# Patient Record
Sex: Female | Born: 1975 | Race: White | Hispanic: No | Marital: Single | Smoking: Former smoker
Health system: Southern US, Community
[De-identification: ages and names within clinical notes are randomized; demographics above are authoritative.]

## PROBLEM LIST (undated history)

## (undated) DIAGNOSIS — A549 Gonococcal infection, unspecified: Secondary | ICD-10-CM

## (undated) DIAGNOSIS — F419 Anxiety disorder, unspecified: Secondary | ICD-10-CM

## (undated) DIAGNOSIS — N809 Endometriosis, unspecified: Secondary | ICD-10-CM

## (undated) HISTORY — PX: TUBAL LIGATION: SHX77

## (undated) HISTORY — DX: Anxiety disorder, unspecified: F41.9

## (undated) HISTORY — PX: OTHER SURGICAL HISTORY: SHX169

---

## 2006-12-12 ENCOUNTER — Emergency Department (HOSPITAL_COMMUNITY): Admission: EM | Admit: 2006-12-12 | Discharge: 2006-12-12 | Payer: Self-pay | Admitting: Emergency Medicine

## 2007-09-08 ENCOUNTER — Emergency Department (HOSPITAL_COMMUNITY): Admission: EM | Admit: 2007-09-08 | Discharge: 2007-09-09 | Payer: Self-pay | Admitting: Emergency Medicine

## 2010-10-15 LAB — WET PREP, GENITAL: Clue Cells Wet Prep HPF POC: NONE SEEN

## 2010-10-15 LAB — HERPES SIMPLEX VIRUS CULTURE

## 2010-10-22 ENCOUNTER — Emergency Department (HOSPITAL_COMMUNITY)
Admission: EM | Admit: 2010-10-22 | Discharge: 2010-10-22 | Disposition: A | Discharge status: Other Acute Inpatient Hospital | Payer: Self-pay | Attending: Emergency Medicine | Admitting: Emergency Medicine

## 2010-10-22 DIAGNOSIS — Z79899 Other long term (current) drug therapy: Secondary | ICD-10-CM | POA: Insufficient documentation

## 2010-10-22 DIAGNOSIS — F1110 Opioid abuse, uncomplicated: Secondary | ICD-10-CM | POA: Insufficient documentation

## 2010-10-22 LAB — URINALYSIS, ROUTINE W REFLEX MICROSCOPIC
Bilirubin Urine: NEGATIVE
Ketones, ur: NEGATIVE mg/dL
Nitrite: POSITIVE — AB
Urobilinogen, UA: 0.2 mg/dL (ref 0.0–1.0)
pH: 5.5 (ref 5.0–8.0)

## 2010-10-22 LAB — BASIC METABOLIC PANEL
BUN: 11 mg/dL (ref 6–23)
Creatinine, Ser: 0.58 mg/dL (ref 0.50–1.10)
GFR calc Af Amer: >90 mL/min (ref >90)
GFR calc non Af Amer: >90 mL/min (ref >90)

## 2010-10-22 LAB — CBC
MCH: 31 pg (ref 26.0–34.0)
MCHC: 34.8 g/dL (ref 30.0–36.0)
MCV: 89 fL (ref 78.0–100.0)
Platelets: 206 10*3/uL (ref 150–400)
RDW: 13.7 % (ref 11.5–15.5)

## 2010-10-22 LAB — DIFFERENTIAL
Eosinophils Absolute: 0 10*3/uL (ref 0.0–0.7)
Eosinophils Relative: 1 % (ref 0–5)
Lymphs Abs: 2.4 10*3/uL (ref 0.7–4.0)
Monocytes Relative: 7 % (ref 3–12)

## 2010-10-22 LAB — URINE MICROSCOPIC-ADD ON

## 2010-10-22 LAB — RAPID URINE DRUG SCREEN, HOSP PERFORMED
Barbiturates: NOT DETECTED
Benzodiazepines: NOT DETECTED
Cocaine: NOT DETECTED

## 2011-08-23 ENCOUNTER — Encounter (HOSPITAL_COMMUNITY): Payer: Self-pay | Admitting: *Deleted

## 2011-08-23 ENCOUNTER — Emergency Department (INDEPENDENT_AMBULATORY_CARE_PROVIDER_SITE_OTHER)
Admission: EM | Admit: 2011-08-23 | Discharge: 2011-08-23 | Disposition: A | Discharge status: Home / Self Care | Payer: Self-pay | Source: Home / Self Care

## 2011-08-23 DIAGNOSIS — R071 Chest pain on breathing: Secondary | ICD-10-CM

## 2011-08-23 DIAGNOSIS — R0789 Other chest pain: Secondary | ICD-10-CM

## 2011-08-23 DIAGNOSIS — T148XXA Other injury of unspecified body region, initial encounter: Secondary | ICD-10-CM

## 2011-08-23 MED ORDER — NAPROXEN 500 MG PO TABS: 500.0000 mg | ORAL_TABLET | Freq: Two times a day (BID) | ORAL | Status: DC

## 2011-08-23 NOTE — ED Notes (Signed)
Breast  Pain    r  Side         Worse  On  Movement  Pain  r  Side  Of  Breast  -  Tender  To  Touch  denys  Any  Injury        Symptoms  X  3  Days  Pt reports  Nipple  Is   Tender    -    Pt  Also  Reports  Sinus  Congestion      And  Pain

## 2011-08-23 NOTE — ED Provider Notes (Signed)
History     CSN: [id number]  Arrival date & time 08/23/11  1147   None     Chief Complaint  Patient presents with  . Breast Pain    (Consider location/radiation/quality/duration/timing/severity/associated sxs/prior treatment) Patient is a 36 y.o. female presenting with chest pain. The history is provided by the patient.  Chest Pain The chest pain began 5 - 7 days ago. Chest pain occurs intermittently. The chest pain is unchanged. The severity of the pain is mild. The quality of the pain is described as burning and sharp. The pain radiates to the left shoulder. Chest pain is worsened by certain positions. She tried nothing for the symptoms. Risk factors include no known risk factors.     History reviewed. No pertinent past medical history.  Past Surgical History  Procedure Date  . Btl     History reviewed. No pertinent family history.  History  Substance Use Topics  . Smoking status: Current Everyday Smoker  . Smokeless tobacco: Not on file  . Alcohol Use: Yes    OB History    Grav Para Term Preterm Abortions TAB SAB Ect Mult Living                  Review of Systems  Constitutional: Negative.   HENT: Negative.   Respiratory: Negative.   Cardiovascular: Positive for chest pain.  Musculoskeletal: Positive for arthralgias. Negative for back pain, joint swelling and gait problem.  Skin: Negative.     Allergies  Review of patient's allergies indicates not on file.  Home Medications   Current Outpatient Rx  Name Route Sig Dispense Refill  . MOTRIN PO Oral Take by mouth.    Marland Kitchen NAPROXEN 500 MG PO TABS Oral Take 1 tablet (500 mg total) by mouth 2 (two) times daily. 30 tablet 0    BP 137/92  Pulse 93  Temp 98.6 F (37 C) (Oral)  Resp 16  SpO2 99%  LMP 08/09/2011  Physical Exam  Nursing note and vitals reviewed. Constitutional: She is oriented to person, place, and time. Vital signs are normal. She appears well-developed and well-nourished. She is active  and cooperative.  HENT:  Head: Normocephalic.  Eyes: Conjunctivae are normal. Pupils are equal, round, and reactive to light. No scleral icterus.  Neck: Trachea normal and normal range of motion. Neck supple.  Cardiovascular: Normal rate and regular rhythm.   Pulmonary/Chest: Effort normal and breath sounds normal. No respiratory distress.  Musculoskeletal: She exhibits tenderness.       Right shoulder: Normal.       Cervical back: Normal.       Thoracic back: Normal.       Right upper arm: Normal.       Arms:      Right chest, midaxillary line  Lymphadenopathy:    She has no cervical adenopathy.  Neurological: She is alert and oriented to person, place, and time. No cranial nerve deficit or sensory deficit.  Skin: Skin is warm and dry.  Psychiatric: She has a normal mood and affect. Her speech is normal and behavior is normal. Judgment and thought content normal. Cognition and memory are normal.    ED Course  Procedures (including critical care time)  Labs Reviewed - No data to display No results found.   1. Muscle strain   2. Right-sided chest wall pain       MDM  Rest, intermittent application of cold packs (later, may switch to heat, but do not sleep on heating  pad), analgesics as recommended. Proper lifting with avoidance of heavy lifting discussed. Call or return to clinic prn if these symptoms worsen or fail to improve as anticipated. Imaging not indicated at this time.         Johnsie Kindred, NP 08/23/11 1245

## 2011-08-24 NOTE — ED Provider Notes (Signed)
Medical screening examination/treatment/procedure(s) were performed by resident physician or non-physician practitioner and as supervising physician I was immediately available for consultation/collaboration.   Barkley Bruns MD.    Linna Hoff, MD 08/24/11 [phone]

## 2011-09-10 ENCOUNTER — Encounter (HOSPITAL_COMMUNITY): Payer: Self-pay | Admitting: Emergency Medicine

## 2011-09-10 DIAGNOSIS — F17200 Nicotine dependence, unspecified, uncomplicated: Secondary | ICD-10-CM | POA: Insufficient documentation

## 2011-09-10 DIAGNOSIS — A64 Unspecified sexually transmitted disease: Secondary | ICD-10-CM | POA: Insufficient documentation

## 2011-09-10 DIAGNOSIS — R935 Abnormal findings on diagnostic imaging of other abdominal regions, including retroperitoneum: Secondary | ICD-10-CM | POA: Insufficient documentation

## 2011-09-10 DIAGNOSIS — R1031 Right lower quadrant pain: Secondary | ICD-10-CM | POA: Insufficient documentation

## 2011-09-10 DIAGNOSIS — Z8742 Personal history of other diseases of the female genital tract: Secondary | ICD-10-CM | POA: Insufficient documentation

## 2011-09-10 NOTE — ED Notes (Signed)
PT. REPORTS RLQ PAIN RADIATING TO RIGHT LOWER BACK AND RIGHT LEG ONSET THIS MORNING WITH SLIGHT NAUSEA. DENEIS VOMITTING OR DIARRHEA , NO FEVER OR VAGINAL DISCHARGE.

## 2011-09-11 ENCOUNTER — Emergency Department (HOSPITAL_COMMUNITY): Payer: Self-pay

## 2011-09-11 ENCOUNTER — Emergency Department (HOSPITAL_COMMUNITY)
Admission: EM | Admit: 2011-09-11 | Discharge: 2011-09-11 | Disposition: A | Discharge status: Home / Self Care | Payer: Self-pay | Attending: Emergency Medicine | Admitting: Emergency Medicine

## 2011-09-11 DIAGNOSIS — A64 Unspecified sexually transmitted disease: Secondary | ICD-10-CM

## 2011-09-11 HISTORY — DX: Endometriosis, unspecified: N80.9

## 2011-09-11 LAB — CBC WITH DIFFERENTIAL/PLATELET
Basophils Absolute: 0.1 10*3/uL (ref 0.0–0.1)
Basophils Relative: 0 % (ref 0–1)
Eosinophils Absolute: 0.1 10*3/uL (ref 0.0–0.7)
Eosinophils Relative: 1 % (ref 0–5)
HCT: 36.4 % (ref 36.0–46.0)
MCH: 30.3 pg (ref 26.0–34.0)
MCHC: 34.1 g/dL (ref 30.0–36.0)
Monocytes Absolute: 0.9 10*3/uL (ref 0.1–1.0)
Neutro Abs: 7.3 10*3/uL (ref 1.7–7.7)
RDW: 13.4 % (ref 11.5–15.5)

## 2011-09-11 LAB — URINALYSIS, ROUTINE W REFLEX MICROSCOPIC
Bilirubin Urine: NEGATIVE
Ketones, ur: 15 mg/dL — AB
Nitrite: NEGATIVE
Protein, ur: NEGATIVE mg/dL

## 2011-09-11 LAB — BASIC METABOLIC PANEL
Calcium: 9.2 mg/dL (ref 8.4–10.5)
Creatinine, Ser: 0.57 mg/dL (ref 0.50–1.10)
GFR calc Af Amer: >90 mL/min (ref >90)
GFR calc non Af Amer: >90 mL/min (ref >90)

## 2011-09-11 LAB — WET PREP, GENITAL: Yeast Wet Prep HPF POC: NONE SEEN

## 2011-09-11 MED ORDER — OXYCODONE-ACETAMINOPHEN 5-325 MG PO TABS: 1.0000 | ORAL_TABLET | Freq: Four times a day (QID) | ORAL | Status: AC | PRN

## 2011-09-11 MED ORDER — LIDOCAINE HCL (PF) 1 % IJ SOLN
INTRAMUSCULAR | Status: AC
  Filled 2011-09-11: qty 5

## 2011-09-11 MED ORDER — CEFTRIAXONE SODIUM 250 MG IJ SOLR
250.0000 mg | Freq: Once | INTRAMUSCULAR | Status: AC
  Administered 2011-09-11: 250 mg via INTRAMUSCULAR
  Filled 2011-09-11: qty 250

## 2011-09-11 MED ORDER — METRONIDAZOLE 500 MG PO TABS
2000.0000 mg | ORAL_TABLET | Freq: Once | ORAL | Status: AC
  Administered 2011-09-11: 2000 mg via ORAL
  Filled 2011-09-11: qty 4

## 2011-09-11 MED ORDER — OXYCODONE-ACETAMINOPHEN 5-325 MG PO TABS
1.0000 | ORAL_TABLET | Freq: Once | ORAL | Status: AC
  Administered 2011-09-11: 1 via ORAL
  Filled 2011-09-11: qty 1

## 2011-09-11 MED ORDER — AZITHROMYCIN 250 MG PO TABS
1000.0000 mg | ORAL_TABLET | Freq: Once | ORAL | Status: AC
  Administered 2011-09-11: 1000 mg via ORAL
  Filled 2011-09-11: qty 4

## 2011-09-11 NOTE — ED Provider Notes (Signed)
History     CSN: [id number]  Arrival date & time 09/10/11  2348   First MD Initiated Contact with Patient 09/11/11 [phone]      Chief Complaint  Patient presents with  . Abdominal Pain    (Consider location/radiation/quality/duration/timing/severity/associated sxs/prior treatment) HPI Comments: Patient is a 36 year old female with a history of endometriosis that presents emergency department with chief complaint of right lower quadrant abdominal pain.  Onset of symptoms began earlier this morning with abdominal cramping that gradually localized to the right lower quadrant. Pain is currently 8/10 in severity and described as sharp.  Pain is worsened with movement and palpation.  Patient has not tried any medications to alleviate pain.  Patient denies any abnormal discharge, nausea, vomiting, diarrhea, constipation, fever, night sweats, or chills.  Last menstrual period ended yesterday.  Last normal bowel movement yesterday as well.  Patient did not have a history of abdominal surgery.  Patient is a 36 y.o. female presenting with abdominal pain. The history is provided by the patient.  Abdominal Pain The primary symptoms of the illness include abdominal pain. The primary symptoms of the illness do not include fever, shortness of breath, vomiting, diarrhea or dysuria.  Symptoms associated with the illness do not include chills, urgency or frequency.    Past Medical History  Diagnosis Date  . Endometriosis     Past Surgical History  Procedure Date  . Btl   . Tubal ligation     No family history on file.  History  Substance Use Topics  . Smoking status: Current Everyday Smoker  . Smokeless tobacco: Not on file  . Alcohol Use: Yes    OB History    Grav Para Term Preterm Abortions TAB SAB Ect Mult Living                  Review of Systems  Constitutional: Negative for fever, chills and appetite change.  HENT: Negative for congestion.   Eyes: Negative for visual disturbance.    Respiratory: Negative for shortness of breath.   Cardiovascular: Negative for chest pain and leg swelling.  Gastrointestinal: Positive for abdominal pain. Negative for vomiting, diarrhea and abdominal distention.  Genitourinary: Negative for dysuria, urgency and frequency.  Neurological: Negative for dizziness, syncope, weakness, light-headedness, numbness and headaches.  Psychiatric/Behavioral: Negative for confusion.  All other systems reviewed and are negative.    Allergies  Review of patient's allergies indicates no known allergies.  Home Medications   Current Outpatient Rx  Name Route Sig Dispense Refill  . IBUPROFEN 200 MG PO TABS Oral Take 200 mg by mouth every 6 (six) hours as needed. For pain    . NAPROXEN SODIUM 220 MG PO TABS Oral Take 220 mg by mouth daily as needed. For pain      BP 135/91  Pulse 94  Temp 98.5 F (36.9 C) (Oral)  Resp 18  SpO2 100%  LMP 08/05/2011  Physical Exam  Nursing note and vitals reviewed. Constitutional: Vital signs are normal. She appears well-developed and well-nourished. No distress.  HENT:  Head: Normocephalic and atraumatic.  Mouth/Throat: Uvula is midline, oropharynx is clear and moist and mucous membranes are normal.  Eyes: Conjunctivae and EOM are normal. Pupils are equal, round, and reactive to light.  Neck: Normal range of motion and full passive range of motion without pain. Neck supple. No spinous process tenderness and no muscular tenderness present. No rigidity. No Brudzinski's sign noted.  Cardiovascular: Normal rate and regular rhythm.   Pulmonary/Chest: Effort  normal and breath sounds normal. No accessory muscle usage. Not tachypneic. No respiratory distress.  Abdominal: Soft. Normal appearance. She exhibits no distension, no ascites, no pulsatile midline mass and no mass. There is tenderness. There is no CVA tenderness. No hernia.         Bowel sounds present, soft abdomen with RLQ ttp, no peritoneal signs.    Genitourinary:       Exam chaperoned.  External genitalia normal without lesions. Cervix normal without purulent discharge or motion tenderness.  Tenderness to palpation of right adnexa but no palpable masses.  No tenderness to left adnexa.  GC and Chlamydia cultures pending  Lymphadenopathy:    She has no cervical adenopathy.  Neurological: She is alert.  Skin: Skin is warm and dry. No rash noted. She is not diaphoretic.  Psychiatric: She has a normal mood and affect. Her speech is normal and behavior is normal.    ED Course  Procedures (including critical care time)  Labs Reviewed  CBC WITH DIFFERENTIAL - Abnormal; Notable for the following:    WBC 11.7 (*)     All other components within normal limits  URINALYSIS, ROUTINE W REFLEX MICROSCOPIC - Abnormal; Notable for the following:    APPearance CLOUDY (*)     Hgb urine dipstick MODERATE (*)     Ketones, ur 15 (*)     Leukocytes, UA TRACE (*)     All other components within normal limits  URINE MICROSCOPIC-ADD ON - Abnormal; Notable for the following:    Bacteria, UA FEW (*)     All other components within normal limits  WET PREP, GENITAL - Abnormal; Notable for the following:    Trich, Wet Prep MODERATE (*)     Clue Cells Wet Prep HPF POC MANY (*)     WBC, Wet Prep HPF POC TOO NUMEROUS TO COUNT (*)     All other components within normal limits  BASIC METABOLIC PANEL  POCT PREGNANCY, URINE  GC/CHLAMYDIA PROBE AMP, GENITAL   US Transvaginal Non-ob  09/11/2011  *RADIOLOGY REPORT*  Clinical Data:  Right lower quadrant pain question ovarian torsion  TRANSABDOMINAL AND TRANSVAGINAL ULTRASOUND OF PELVIS DOPPLER ULTRASOUND OF OVARIES  Technique:  Both transabdominal and transvaginal ultrasound examinations of the pelvis were performed. Transabdominal technique was performed for global imaging of the pelvis including uterus, ovaries, adnexal regions, and pelvic cul-de-sac.  It was necessary to proceed with endovaginal exam following the  transabdominal exam to visualize the endometrium and ovaries.  Color and duplex Doppler ultrasound was utilized to evaluate blood flow to the ovaries.  Comparison:  None  Findings:  Uterus:  9.2 cm length by 3.9 cm AP by 5.8 cm transverse.  Normal morphology without mass.  Endometrium:  4 mm thick, normal.  No endometrial fluid.  Right ovary: 3.0 x 1.5 x 1.9 cm.  Normal morphology without mass. Blood flow present within right ovary on color Doppler imaging.  Left ovary: 2.4 x 2.0 x 2.0 cm.  Normal morphology without mass. Internal blood flow present within the left ovary on color Doppler imaging.  Pulsed Doppler evaluation demonstrates normal low-resistance arterial and venous waveforms in both ovaries.  In the right adnexa, adjacent to and superior to the right ovary, a tubular hypoechoic/fluid structure is identified containing a few scattered low level internal echoes and question slight wall thickening, question hydrosalpinx versus pyosalpinx.  Tubo-ovarian abscess not excluded.  IMPRESSION: Unremarkable uterus and ovaries. Hypoechoic tubular structure adjacent to the right ovary, question dilated fallopian  tube, question hydrosalpinx versus pyosalpinx or tubo-ovarian abscess.  No sonographic evidence for ovarian torsion.   Original Report Authenticated By: Lollie Marrow, M.D.    US Pelvis Complete  09/11/2011  *RADIOLOGY REPORT*  Clinical Data:  Right lower quadrant pain question ovarian torsion  TRANSABDOMINAL AND TRANSVAGINAL ULTRASOUND OF PELVIS DOPPLER ULTRASOUND OF OVARIES  Technique:  Both transabdominal and transvaginal ultrasound examinations of the pelvis were performed. Transabdominal technique was performed for global imaging of the pelvis including uterus, ovaries, adnexal regions, and pelvic cul-de-sac.  It was necessary to proceed with endovaginal exam following the transabdominal exam to visualize the endometrium and ovaries.  Color and duplex Doppler ultrasound was utilized to evaluate blood  flow to the ovaries.  Comparison:  None  Findings:  Uterus:  9.2 cm length by 3.9 cm AP by 5.8 cm transverse.  Normal morphology without mass.  Endometrium:  4 mm thick, normal.  No endometrial fluid.  Right ovary: 3.0 x 1.5 x 1.9 cm.  Normal morphology without mass. Blood flow present within right ovary on color Doppler imaging.  Left ovary: 2.4 x 2.0 x 2.0 cm.  Normal morphology without mass. Internal blood flow present within the left ovary on color Doppler imaging.  Pulsed Doppler evaluation demonstrates normal low-resistance arterial and venous waveforms in both ovaries.  In the right adnexa, adjacent to and superior to the right ovary, a tubular hypoechoic/fluid structure is identified containing a few scattered low level internal echoes and question slight wall thickening, question hydrosalpinx versus pyosalpinx.  Tubo-ovarian abscess not excluded.  IMPRESSION: Unremarkable uterus and ovaries. Hypoechoic tubular structure adjacent to the right ovary, question dilated fallopian tube, question hydrosalpinx versus pyosalpinx or tubo-ovarian abscess.  No sonographic evidence for ovarian torsion.   Original Report Authenticated By: Lollie Marrow, M.D.    [place] Art/ven Flow Abd Pelv Doppler  09/11/2011  *RADIOLOGY REPORT*  Clinical Data:  Right lower quadrant pain question ovarian torsion  TRANSABDOMINAL AND TRANSVAGINAL ULTRASOUND OF PELVIS DOPPLER ULTRASOUND OF OVARIES  Technique:  Both transabdominal and transvaginal ultrasound examinations of the pelvis were performed. Transabdominal technique was performed for global imaging of the pelvis including uterus, ovaries, adnexal regions, and pelvic cul-de-sac.  It was necessary to proceed with endovaginal exam following the transabdominal exam to visualize the endometrium and ovaries.  Color and duplex Doppler ultrasound was utilized to evaluate blood flow to the ovaries.  Comparison:  None  Findings:  Uterus:  9.2 cm length by 3.9 cm AP by 5.8 cm transverse.  Normal  morphology without mass.  Endometrium:  4 mm thick, normal.  No endometrial fluid.  Right ovary: 3.0 x 1.5 x 1.9 cm.  Normal morphology without mass. Blood flow present within right ovary on color Doppler imaging.  Left ovary: 2.4 x 2.0 x 2.0 cm.  Normal morphology without mass. Internal blood flow present within the left ovary on color Doppler imaging.  Pulsed Doppler evaluation demonstrates normal low-resistance arterial and venous waveforms in both ovaries.  In the right adnexa, adjacent to and superior to the right ovary, a tubular hypoechoic/fluid structure is identified containing a few scattered low level internal echoes and question slight wall thickening, question hydrosalpinx versus pyosalpinx.  Tubo-ovarian abscess not excluded.  IMPRESSION: Unremarkable uterus and ovaries. Hypoechoic tubular structure adjacent to the right ovary, question dilated fallopian tube, question hydrosalpinx versus pyosalpinx or tubo-ovarian abscess.  No sonographic evidence for ovarian torsion.   Original Report Authenticated By: Lollie Marrow, M.D.      No  diagnosis found.    MDM  STD  Discussed importance of using protection when sexually active. Pt understands that they have GC/Chlamydia cultures pending and that they will need to inform all sexual partners if results return positive. Pt has been treated prophylacticly with azithromycin and rocephin due to pts history, pelvic exam, and wet prep with increased WBCs. Pt not concerning for PID because hemodynamically stable and no cervical motion tenderness on pelvic exam. Pt has also been treated with flagyl for Bacterial Vaginosis. Pt has been advised to not drink alcohol while on this medication. [place] with hypoechoic tubular structure next to Right ovary discussed with GYN. D/t normal vitals and no peritoneal signs on exam pt will be dc with close follow up and strict return precautions.   Consult: GYN, Dr. Henderson Cloud- Pt is to f-u as an OP in his office  thursaday.         Jaci Carrel, [place] 09/11/11 [phone]

## 2011-09-11 NOTE — ED Notes (Signed)
Pt. Verbalized understanding of follow up and rx x1.

## 2011-09-11 NOTE — ED Provider Notes (Signed)
Medical screening examination/treatment/procedure(s) were performed by non-physician practitioner and as supervising physician I was immediately available for consultation/collaboration.  Cheri Guppy, MD 09/11/11 [phone]

## 2011-09-14 NOTE — ED Notes (Signed)
Attempted to contact patient. Wrong phone number.

## 2011-09-14 NOTE — ED Notes (Signed)
+  Gonorrhea. Patient treated with Rocephin and Zithromax. Per protocol MD. [facility] faxed. °

## 2012-06-01 ENCOUNTER — Emergency Department (HOSPITAL_COMMUNITY)
Admission: EM | Admit: 2012-06-01 | Discharge: 2012-06-01 | Disposition: A | Discharge status: Home / Self Care | Payer: Self-pay | Attending: Emergency Medicine | Admitting: Emergency Medicine

## 2012-06-01 ENCOUNTER — Encounter (HOSPITAL_COMMUNITY): Payer: Self-pay | Admitting: Adult Health

## 2012-06-01 DIAGNOSIS — Z8742 Personal history of other diseases of the female genital tract: Secondary | ICD-10-CM | POA: Insufficient documentation

## 2012-06-01 DIAGNOSIS — F17200 Nicotine dependence, unspecified, uncomplicated: Secondary | ICD-10-CM | POA: Insufficient documentation

## 2012-06-01 DIAGNOSIS — N898 Other specified noninflammatory disorders of vagina: Secondary | ICD-10-CM | POA: Insufficient documentation

## 2012-06-01 DIAGNOSIS — Z3202 Encounter for pregnancy test, result negative: Secondary | ICD-10-CM | POA: Insufficient documentation

## 2012-06-01 DIAGNOSIS — A64 Unspecified sexually transmitted disease: Secondary | ICD-10-CM

## 2012-06-01 DIAGNOSIS — R300 Dysuria: Secondary | ICD-10-CM | POA: Insufficient documentation

## 2012-06-01 DIAGNOSIS — Z118 Encounter for screening for other infectious and parasitic diseases: Secondary | ICD-10-CM | POA: Insufficient documentation

## 2012-06-01 HISTORY — DX: Gonococcal infection, unspecified: A54.9

## 2012-06-01 LAB — URINALYSIS, ROUTINE W REFLEX MICROSCOPIC
Ketones, ur: 40 mg/dL — AB
Leukocytes, UA: NEGATIVE
Nitrite: NEGATIVE
Protein, ur: NEGATIVE mg/dL
Urobilinogen, UA: 1 mg/dL (ref 0.0–1.0)
pH: 7 (ref 5.0–8.0)

## 2012-06-01 MED ORDER — LIDOCAINE HCL (PF) 1 % IJ SOLN
1.0000 mL | Freq: Once | INTRAMUSCULAR | Status: AC
  Administered 2012-06-01: 1 mL
  Filled 2012-06-01: qty 5

## 2012-06-01 MED ORDER — CEFTRIAXONE SODIUM 250 MG IJ SOLR
250.0000 mg | Freq: Once | INTRAMUSCULAR | Status: AC
  Administered 2012-06-01: 250 mg via INTRAMUSCULAR
  Filled 2012-06-01: qty 250

## 2012-06-01 MED ORDER — AZITHROMYCIN 250 MG PO TABS
1000.0000 mg | ORAL_TABLET | Freq: Once | ORAL | Status: AC
  Administered 2012-06-01: 1000 mg via ORAL
  Filled 2012-06-01: qty 4

## 2012-06-01 NOTE — ED Provider Notes (Signed)
History  This chart was scribed for non-physician practitioner working with Gwyneth Sprout, MD by Greggory Stallion, ED scribe. This patient was seen in room TR09C/TR09C and the patient's care was started at 6:00 PM.  CSN: [id number]  Arrival date & time 06/01/12  1510   Chief Complaint  Patient presents with  . SEXUALLY TRANSMITTED DISEASE    The history is provided by the patient. No language interpreter was used.    HPI Comments: Thressa Shiffer is a 37 y.o. female who presents to the Emergency Department complaining of a sexually transmitted disease. Pt states she has had thick, whitish/yellowish vaginal discharge that began in February with associated dysuria and vaginal itching. She states, "I am 100% sure that I have gonorrhea. I have had it before and this feels the same." She states she was last sexually active with this person one month ago. Pt denies vaginal pain, fever, neck pain, sore throat, visual disturbance, CP, cough, SOB, abdominal pain, nausea, emesis, diarrhea, back pain, HA, weakness, numbness and rash as associated symptoms. She states she still has normal menses.    Past Medical History  Diagnosis Date  . Endometriosis   . Gonorrhea     Past Surgical History  Procedure Laterality Date  . Btl    . Tubal ligation      History reviewed. No pertinent family history.  History  Substance Use Topics  . Smoking status: Current Every Day Smoker  . Smokeless tobacco: Not on file  . Alcohol Use: Yes    OB History   Grav Para Term Preterm Abortions TAB SAB Ect Mult Living                  Review of Systems  A complete 10 system review of systems was obtained and all systems are negative except as noted in the HPI and PMH.   Allergies  Review of patient's allergies indicates no known allergies.  Home Medications   Current Outpatient Rx  Name  Route  Sig  Dispense  Refill  . ibuprofen (ADVIL,MOTRIN) 200 MG tablet   Oral   Take 200 mg by mouth every 6  (six) hours as needed. For pain         . naproxen sodium (ANAPROX) 220 MG tablet   Oral   Take 220 mg by mouth daily as needed. For pain           BP 134/86  Pulse 65  Temp(Src) 98.5 F (36.9 C) (Oral)  Resp 18  SpO2 98%  Physical Exam  Nursing note and vitals reviewed. Constitutional: She is oriented to person, place, and time. She appears well-developed and well-nourished.  HENT:  Head: Normocephalic and atraumatic.  Neck: Normal range of motion.  Cardiovascular: Normal rate and regular rhythm.   Pulmonary/Chest: Effort normal and breath sounds normal.  Abdominal: Soft. There is no tenderness.  Genitourinary:  Vaginal discharge. No cervical motion tenderness. No adnexal tenderness.   Musculoskeletal: Normal range of motion.  Neurological: She is alert and oriented to person, place, and time.  Skin: Skin is warm and dry.    ED Course  Procedures (including critical care time)  DIAGNOSTIC STUDIES: Oxygen Saturation is 98% on RA, normal by my interpretation.    COORDINATION OF CARE: 6:10 PM-Discussed treatment plan with pt at bedside and pt agreed to plan.   Labs Reviewed  URINALYSIS, ROUTINE W REFLEX MICROSCOPIC - Abnormal; Notable for the following:    Bilirubin Urine SMALL (*)    Ketones,  ur 40 (*)    All other components within normal limits  POCT PREGNANCY, URINE   No results found.   1. Vaginal discharge   2. STD (female)       MDM   Patient with worsening vaginal discharge over last few months. She denies any fever or systemic symptoms. No abdominal or back pain. Mild dysuria over the last one to 2 weeks and normal menses. Patient states she's had gonorrhea in the past and was treated last year but then patient developed symptoms again in January or February he was having unprotected sex with the same partner think she had a second impaction. On exam she has mild discharge but no other exam findings. Few white blood cells on wet prep and a negative  urine pregnancy and urine tests.  Patient treated with Rocephin and azithromycin and discharged home.      I personally performed the services described in this documentation, which was scribed in my presence.  The recorded information has been reviewed and considered.   Gwyneth Sprout, MD 06/01/12 [phone]

## 2012-06-01 NOTE — ED Notes (Signed)
Presents with vaginal discharge that began in feb. She states, "I am 100% that I have gonorrhea.. I have had it before and this feels the same"  Denies pain. Reports thick vaginal discharge and burning with urination.

## 2012-06-02 LAB — GC/CHLAMYDIA PROBE AMP
CT Probe RNA: NEGATIVE
GC Probe RNA: NEGATIVE

## 2012-07-03 ENCOUNTER — Emergency Department (HOSPITAL_COMMUNITY)
Admission: EM | Admit: 2012-07-03 | Discharge: 2012-07-04 | Disposition: A | Discharge status: Other Acute Inpatient Hospital | Payer: Self-pay | Attending: Emergency Medicine | Admitting: Emergency Medicine

## 2012-07-03 ENCOUNTER — Encounter (HOSPITAL_COMMUNITY): Payer: Self-pay | Admitting: Family Medicine

## 2012-07-03 DIAGNOSIS — F329 Major depressive disorder, single episode, unspecified: Secondary | ICD-10-CM | POA: Insufficient documentation

## 2012-07-03 DIAGNOSIS — R45851 Suicidal ideations: Secondary | ICD-10-CM | POA: Insufficient documentation

## 2012-07-03 DIAGNOSIS — Z3202 Encounter for pregnancy test, result negative: Secondary | ICD-10-CM | POA: Insufficient documentation

## 2012-07-03 DIAGNOSIS — F1110 Opioid abuse, uncomplicated: Secondary | ICD-10-CM

## 2012-07-03 DIAGNOSIS — F1120 Opioid dependence, uncomplicated: Secondary | ICD-10-CM | POA: Insufficient documentation

## 2012-07-03 DIAGNOSIS — F17200 Nicotine dependence, unspecified, uncomplicated: Secondary | ICD-10-CM | POA: Insufficient documentation

## 2012-07-03 DIAGNOSIS — Z8619 Personal history of other infectious and parasitic diseases: Secondary | ICD-10-CM | POA: Insufficient documentation

## 2012-07-03 DIAGNOSIS — F32A Depression, unspecified: Secondary | ICD-10-CM

## 2012-07-03 DIAGNOSIS — F3289 Other specified depressive episodes: Secondary | ICD-10-CM | POA: Insufficient documentation

## 2012-07-03 DIAGNOSIS — Z8742 Personal history of other diseases of the female genital tract: Secondary | ICD-10-CM | POA: Insufficient documentation

## 2012-07-03 LAB — COMPREHENSIVE METABOLIC PANEL
ALT: 11 U/L (ref 0–35)
Alkaline Phosphatase: 48 U/L (ref 39–117)
BUN: 9 mg/dL (ref 6–23)
Chloride: 103 mEq/L (ref 96–112)
GFR calc Af Amer: >90 mL/min (ref >90)
Glucose, Bld: 91 mg/dL (ref 70–99)
Potassium: 3.5 mEq/L (ref 3.5–5.1)
Sodium: 140 mEq/L (ref 135–145)
Total Bilirubin: 0.3 mg/dL (ref 0.3–1.2)

## 2012-07-03 LAB — CBC
HCT: 40.8 % (ref 36.0–46.0)
Hemoglobin: 14.3 g/dL (ref 12.0–15.0)
RBC: 4.77 MIL/uL (ref 3.87–5.11)
WBC: 12.4 10*3/uL — ABNORMAL HIGH (ref 4.0–10.5)

## 2012-07-03 LAB — RAPID URINE DRUG SCREEN, HOSP PERFORMED
Amphetamines: NOT DETECTED
Barbiturates: NOT DETECTED
Cocaine: NOT DETECTED
Opiates: POSITIVE — AB
Tetrahydrocannabinol: POSITIVE — AB

## 2012-07-03 LAB — ETHANOL: Alcohol, Ethyl (B): <11 mg/dL (ref 0–11)

## 2012-07-03 MED ORDER — ONDANSETRON HCL 4 MG PO TABS: 4.0000 mg | ORAL_TABLET | Freq: Three times a day (TID) | ORAL | Status: DC | PRN

## 2012-07-03 MED ORDER — ALUM & MAG HYDROXIDE-SIMETH 200-200-20 MG/5ML PO SUSP: 30.0000 mL | ORAL | Status: DC | PRN

## 2012-07-03 MED ORDER — IBUPROFEN 400 MG PO TABS
600.0000 mg | ORAL_TABLET | Freq: Three times a day (TID) | ORAL | Status: DC | PRN
  Administered 2012-07-03 – 2012-07-04 (×2): 600 mg via ORAL
  Filled 2012-07-03 (×2): qty 1

## 2012-07-03 MED ORDER — ACETAMINOPHEN 325 MG PO TABS: 650.0000 mg | ORAL_TABLET | ORAL | Status: DC | PRN

## 2012-07-03 MED ORDER — ZOLPIDEM TARTRATE 5 MG PO TABS: 5.0000 mg | ORAL_TABLET | Freq: Every evening | ORAL | Status: DC | PRN

## 2012-07-03 MED ORDER — LORAZEPAM 1 MG PO TABS
1.0000 mg | ORAL_TABLET | Freq: Three times a day (TID) | ORAL | Status: DC | PRN
  Administered 2012-07-03: 1 mg via ORAL
  Filled 2012-07-03: qty 1

## 2012-07-03 MED ORDER — NICOTINE 21 MG/24HR TD PT24
21.0000 mg | MEDICATED_PATCH | Freq: Every day | TRANSDERMAL | Status: DC
  Administered 2012-07-03: 21 mg via TRANSDERMAL
  Filled 2012-07-03 (×2): qty 1

## 2012-07-03 NOTE — ED Provider Notes (Signed)
History    This chart was scribed for non-physician practitioner, Fayrene Helper PA-C, working with Gwyneth Sprout, MD by Donne Anon, ED Scribe. This patient was seen in room TR06C/TR06C and the patient's care was started at 1901.  CSN: [id number] Arrival date & time 07/03/12  1719  First MD Initiated Contact with Patient 07/03/12 1901     Chief Complaint  Patient presents with  . Suicidal    The history is provided by the patient. No language interpreter was used.   HPI Comments: Carrie Hammond is a 37 y.o. female who presents to the Emergency Department complaining of SI. She states she has always had fleeting thoughts but she has never had a plan until today. This morning she tried to overdose on heroin to kill herself. She reports she used heroine her whole life, stopped 1.5 years ago, and recently began using again. She states death seems easier than detoxing from heroin. She reports feeling depressed. She denies HI or hallucinations. She denies CP, abdominal pain or any other pain.  Has poor family support.    She reports she smokes cigarettes but denies alcohol use. She denies any other illicit drug use.      Past Medical History  Diagnosis Date  . Endometriosis   . Gonorrhea    Past Surgical History  Procedure Laterality Date  . Btl    . Tubal ligation     History reviewed. No pertinent family history. History  Substance Use Topics  . Smoking status: Current Every Day Smoker  . Smokeless tobacco: Not on file  . Alcohol Use: Yes   OB History   Grav Para Term Preterm Abortions TAB SAB Ect Mult Living                 Review of Systems  Psychiatric/Behavioral: Positive for suicidal ideas. Negative for hallucinations.  All other systems reviewed and are negative.    Allergies  Review of patient's allergies indicates no known allergies.  Home Medications  No current outpatient prescriptions on file.  BP 127/85  Temp(Src) 98.2 F (36.8 C) (Oral)  Resp 18   SpO2 100%  LMP 06/12/2012  Physical Exam  Nursing note and vitals reviewed. Constitutional: She appears well-developed and well-nourished. No distress.  HENT:  Head: Normocephalic and atraumatic.  Right Ear: Tympanic membrane normal.  Left Ear: Tympanic membrane normal.  Mouth/Throat: Oropharynx is clear and moist. No oropharyngeal exudate.  Eyes: Conjunctivae are normal.  Neck: Neck supple. No tracheal deviation present.  Cardiovascular: Normal rate, regular rhythm and normal heart sounds.   Pulmonary/Chest: Effort normal and breath sounds normal. No respiratory distress. She has no wheezes. She has no rales.  Abdominal: Soft. There is no tenderness.  Musculoskeletal: Normal range of motion.  Neurological: She is alert.  Skin: Skin is warm and dry.  Psychiatric: She has a normal mood and affect. Her behavior is normal.    ED Course  Procedures (including critical care time) DIAGNOSTIC STUDIES: Oxygen Saturation is 100% on RA, normal by my interpretation.    COORDINATION OF CARE: 7:38 PM Discussed treatment plan which includes consult with ACT team with pt at bedside and pt agreed to plan.   9:04 PM Psych hold and med rec filed.   10:20 PM Case discussed with behavioral health ACT team, they agreed to see and assess pt. I will rescind the telepsych order.  Labs Reviewed  CBC - Abnormal; Notable for the following:    WBC 12.4 (*)  All other components within normal limits  SALICYLATE LEVEL - Abnormal; Notable for the following:    Salicylate Lvl <2.0 (*)    All other components within normal limits  COMPREHENSIVE METABOLIC PANEL  ETHANOL  ACETAMINOPHEN LEVEL  URINE RAPID DRUG SCREEN (HOSP PERFORMED)  POCT PREGNANCY, URINE   No results found. 1. Suicidal ideation   2. Depression     MDM  BP 127/85  Temp(Src) 98.2 F (36.8 C) (Oral)  Resp 18  SpO2 100%  LMP 06/12/2012   I personally performed the services described in this documentation, which was scribed  in my presence. The recorded information has been reviewed and is accurate.     Fayrene Helper, PA-C 07/04/12 0001

## 2012-07-03 NOTE — ED Notes (Signed)
Pt reports she is here because she has relapsed on dope and can't believe that she did that again and can't live with herself anymore because she wants to be clean. Pt is SI, denies HI. Last time she used was earlier today, uses all day everyday, sts approx $100 worth a day. Pt in nad, skin warm and dry, resp e/u. Pt in blue scrubs at this moment.
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
## 2012-07-03 NOTE — ED Notes (Signed)
Per pt sts she doesn't want to live. sts she took heroine this am hoping she wouldn't wake up. sts last use before that was a year and half ago. sts she doesn't see the light at the end of the tunnel.

## 2012-07-04 ENCOUNTER — Inpatient Hospital Stay (HOSPITAL_COMMUNITY)
Admission: AD | Admit: 2012-07-04 | Discharge: 2012-07-07 | DRG: 897 | Disposition: A | Discharge status: Home / Self Care | Payer: Federal, State, Local not specified - Other | Source: Ambulatory Visit | Attending: Psychiatry | Admitting: Psychiatry

## 2012-07-04 ENCOUNTER — Encounter (HOSPITAL_COMMUNITY): Payer: Self-pay | Admitting: *Deleted

## 2012-07-04 DIAGNOSIS — F1210 Cannabis abuse, uncomplicated: Secondary | ICD-10-CM | POA: Diagnosis present

## 2012-07-04 DIAGNOSIS — Z79899 Other long term (current) drug therapy: Secondary | ICD-10-CM

## 2012-07-04 DIAGNOSIS — F1120 Opioid dependence, uncomplicated: Principal | ICD-10-CM | POA: Diagnosis present

## 2012-07-04 MED ORDER — CLONIDINE HCL 0.1 MG PO TABS: 0.1000 mg | ORAL_TABLET | Freq: Every day | ORAL | Status: DC

## 2012-07-04 MED ORDER — HYDROXYZINE HCL 25 MG PO TABS
25.0000 mg | ORAL_TABLET | Freq: Four times a day (QID) | ORAL | Status: DC | PRN
  Administered 2012-07-06 (×2): 25 mg via ORAL
  Filled 2012-07-04: qty 1
  Filled 2012-07-04: qty 30

## 2012-07-04 MED ORDER — CLONIDINE HCL 0.1 MG PO TABS
0.1000 mg | ORAL_TABLET | Freq: Four times a day (QID) | ORAL | Status: AC
  Administered 2012-07-04 – 2012-07-07 (×9): 0.1 mg via ORAL
  Filled 2012-07-04 (×10): qty 1

## 2012-07-04 MED ORDER — CLONIDINE HCL 0.1 MG PO TABS: 0.1000 mg | ORAL_TABLET | Freq: Four times a day (QID) | ORAL | Status: DC

## 2012-07-04 MED ORDER — HYDROXYZINE HCL 25 MG PO TABS: 25.0000 mg | ORAL_TABLET | Freq: Four times a day (QID) | ORAL | Status: DC | PRN

## 2012-07-04 MED ORDER — METHOCARBAMOL 500 MG PO TABS
500.0000 mg | ORAL_TABLET | Freq: Three times a day (TID) | ORAL | Status: DC | PRN
  Administered 2012-07-04: 500 mg via ORAL
  Filled 2012-07-04: qty 1

## 2012-07-04 MED ORDER — DICYCLOMINE HCL 20 MG PO TABS: 20.0000 mg | ORAL_TABLET | Freq: Four times a day (QID) | ORAL | Status: DC | PRN

## 2012-07-04 MED ORDER — CLONIDINE HCL 0.1 MG PO TABS
0.1000 mg | ORAL_TABLET | ORAL | Status: DC
  Filled 2012-07-04 (×2): qty 1

## 2012-07-04 MED ORDER — ACETAMINOPHEN 325 MG PO TABS
650.0000 mg | ORAL_TABLET | Freq: Four times a day (QID) | ORAL | Status: DC | PRN
  Administered 2012-07-04 – 2012-07-05 (×2): 650 mg via ORAL

## 2012-07-04 MED ORDER — CLONIDINE HCL 0.1 MG PO TABS: 0.1000 mg | ORAL_TABLET | ORAL | Status: DC

## 2012-07-04 MED ORDER — LOPERAMIDE HCL 2 MG PO CAPS: 2.0000 mg | ORAL_CAPSULE | ORAL | Status: DC | PRN

## 2012-07-04 MED ORDER — NAPROXEN 500 MG PO TABS
500.0000 mg | ORAL_TABLET | Freq: Two times a day (BID) | ORAL | Status: DC | PRN
  Administered 2012-07-04: 500 mg via ORAL
  Filled 2012-07-04: qty 1

## 2012-07-04 MED ORDER — MAGNESIUM HYDROXIDE 400 MG/5ML PO SUSP: 30.0000 mL | Freq: Every day | ORAL | Status: DC | PRN

## 2012-07-04 MED ORDER — LOPERAMIDE HCL 2 MG PO CAPS
2.0000 mg | ORAL_CAPSULE | ORAL | Status: DC | PRN
  Administered 2012-07-06: 4 mg via ORAL

## 2012-07-04 MED ORDER — NAPROXEN 250 MG PO TABS: 500.0000 mg | ORAL_TABLET | Freq: Two times a day (BID) | ORAL | Status: DC | PRN

## 2012-07-04 MED ORDER — ONDANSETRON 4 MG PO TBDP: 4.0000 mg | ORAL_TABLET | Freq: Four times a day (QID) | ORAL | Status: DC | PRN

## 2012-07-04 MED ORDER — METHOCARBAMOL 500 MG PO TABS: 500.0000 mg | ORAL_TABLET | Freq: Three times a day (TID) | ORAL | Status: DC | PRN

## 2012-07-04 MED ORDER — ALUM & MAG HYDROXIDE-SIMETH 200-200-20 MG/5ML PO SUSP: 30.0000 mL | ORAL | Status: DC | PRN
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
## 2012-07-04 NOTE — Progress Notes (Signed)
Psychoeducational Group Note  Date:  07/04/2012 Time:  0945 am  Group Topic/Focus:  Identifying Needs:   The focus of this group is to help patients identify their personal needs that have been historically problematic and identify healthy behaviors to address their needs.  Participation Level:  Did Not Attend  Andrena Mews 07/04/2012,3:48 PM

## 2012-07-04 NOTE — ED Provider Notes (Signed)
Medical screening examination/treatment/procedure(s) were performed by non-physician practitioner and as supervising physician I was immediately available for consultation/collaboration.   Aletha Allebach, MD 07/04/12 2305 

## 2012-07-04 NOTE — BHH Counselor (Signed)
Pt admission documents is signed and faxed to [facility] Assessment Department. Denice Bors, AADC 07/04/2012 10:40 AM

## 2012-07-04 NOTE — BH Assessment (Signed)
BHH Assessment Progress Note  Pt reviewed with Dahlia Byes, NP, who agrees to accept pt to [facility] to the service of Geoffery Lyons, MD, Rm [phone].  At 09:38 I called Ranae Pila, Assessment Counselor to notify her.  Doylene Canning, MA Assessment Counselor 07/04/2012 @ 09:40

## 2012-07-04 NOTE — Progress Notes (Signed)
Child/Adolescent Psychoeducational Group Note  Date:  07/04/2012 Time:  7:10 PM  Group Topic/Focus:  Healthy Communication:   The focus of this group is to discuss communication, barriers to communication, as well as healthy ways to communicate with others.  Participation Level:  Did Not Attend  Participation Quality:  Did not attend  Affect:  Did not attend  Cognitive:  Did not attend  Insight:  None  Engagement in Group:  Did not attend  Modes of Intervention:  Did not attend  Additional Comments:  The purpose of this group was to provide patients with healthy communication skills. Pt did not attend. Caswell Corwin 07/04/2012, 7:10 PM
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
## 2012-07-04 NOTE — ED Notes (Signed)
telepsych called and faxed 

## 2012-07-04 NOTE — Progress Notes (Signed)
Adult Psychoeducational Group Note  Date:  07/04/2012 Time:  8:00PM Group Topic/Focus:  Wrap-Up Group:   The focus of this group is to help patients review their daily goal of treatment and discuss progress on daily workbooks.  Participation Level:  Active  Participation Quality:  Appropriate and Attentive  Affect:  Appropriate  Cognitive:  Alert and Appropriate  Insight: Appropriate  Engagement in Group:  Engaged  Modes of Intervention:  Discussion  Additional Comments:  Pt. Attended AA meeting.   Bing Plume D 07/04/2012, 8:18 PM

## 2012-07-04 NOTE — BHH Suicide Risk Assessment (Signed)
Suicide Risk Assessment  Admission Assessment     Information obtained from: Patient Demographic factors:   Current Mental Status per Physician Patient denies suicidal or homicidal ideation, hallucinations, illusions, or delusions. Patient engages with good eye contact, is able to focus adequately in a one to one setting, and has clear goal directed thoughts. Patient speaks with a natural conversational volume, rate, and tone. Anxiety was reported at 7 on a scale of 1 the least and 10 the most. Depression was reported at 8 on the same scale. Patient is oriented times 4, recent and remote memory intact. Judgement: impaired by addictive thinking Insight: limited by her mental illness and her addictive thinking.  Loss Factors: Decrease in vocational status;Loss of significant relationship Historical Factors: Impulsivity Risk Reduction Factors: Sense of responsibility to family;Responsible for children under 96 years of age;Positive social support  CLINICAL FACTORS:   Dysthymia Alcohol/Substance Abuse/Dependencies  COGNITIVE FEATURES THAT CONTRIBUTE TO RISK:  Closed-mindedness Polarized thinking Thought constriction (tunnel vision)    SUICIDE RISK:   Mild:  Suicidal ideation of limited frequency, intensity, duration, and specificity.  There are no identifiable plans, no associated intent, mild dysphoria and related symptoms, good self-control (both objective and subjective assessment), few other risk factors, and identifiable protective factors, including available and accessible social support.  PLAN OF CARE: 1 Admit for crisis management and stabilization.  Estimate length of stay is 3 to 5 days.  2. Medication management to reduce current symptoms to base line and improve the patient's overall level of functioning. Detox  3. Treat health problems as indicated:  4. Develop treatment plan to decrease risk of relapse upon discharge and the need for readmission.  5. Psycho-social  education regarding relapse prevention and self-care.  6. Review and reinstate any pertinent home medication for other health issues where appropriate.  7. Call for Consult with [facility] for additional specialty patient services as needed.  I certify that inpatient services furnished can reasonably be expected to improve the patient's condition.  Orson Aloe, MD, [facility] 07/04/2012 4:24 PM

## 2012-07-04 NOTE — ED Provider Notes (Signed)
Patient agrees with transfer and is accepted at [facility]. 1020  Hurman Horn, MD 07/05/12 2140

## 2012-07-04 NOTE — Progress Notes (Signed)
Admit Note- Patient admitted to [facility] adult unit after reports of feeling overwhelmed with heroin addiction - she states '' i've just been depressed and I need help with getting that together, I have a drug addiction but the real problem is i feel depressed and then i use'' patient reports using 100 dollars worth of heroin x one month, and using regularly since Feb. Patient denies any current sucidal ideation. Denies any homicidal ideation or AV Hallucinations. Pt oriented to unit. Denies pain. Denies withdrawal symptoms at this time. Q 15 mins checks for safety. Will continue to monitor.

## 2012-07-04 NOTE — Tx Team (Signed)
Initial Interdisciplinary Treatment Plan  PATIENT STRENGTHS: (choose at least two) Ability for insight Active sense of humor General fund of knowledge  PATIENT STRESSORS: Health problems Substance abuse   PROBLEM LIST: Problem List/Patient Goals Date to be addressed Date deferred Reason deferred Estimated date of resolution  Depression  07-04-12   dc        Suicide ideation 07-04-12   dc              Substance abuse 6-28-1`4   dc                     DISCHARGE CRITERIA:  Ability to meet basic life and health needs Adequate post-discharge living arrangements Safe-care adequate arrangements made Verbal commitment to aftercare and medication compliance  PRELIMINARY DISCHARGE PLAN: Attend aftercare/continuing care group Attend PHP/IOP Outpatient therapy  PATIENT/FAMIILY INVOLVEMENT: This treatment plan has been presented to and reviewed with the patient, Kearney Hard, and/or family member, .  The patient and family have been given the opportunity to ask questions and make suggestions.  Malva Limes 07/04/2012, 1:20 PM

## 2012-07-04 NOTE — ED Notes (Signed)
Report called to [facility]

## 2012-07-04 NOTE — ED Notes (Signed)
Security called to transport patient to [facility]

## 2012-07-05 DIAGNOSIS — F1120 Opioid dependence, uncomplicated: Secondary | ICD-10-CM

## 2012-07-05 MED ORDER — ADULT MULTIVITAMIN W/MINERALS CH
1.0000 | ORAL_TABLET | Freq: Every day | ORAL | Status: DC
  Administered 2012-07-05 – 2012-07-07 (×3): 1 via ORAL
  Filled 2012-07-05 (×5): qty 1

## 2012-07-05 MED ORDER — NICOTINE 21 MG/24HR TD PT24
21.0000 mg | MEDICATED_PATCH | Freq: Every day | TRANSDERMAL | Status: DC
  Administered 2012-07-05 – 2012-07-07 (×3): 21 mg via TRANSDERMAL
  Filled 2012-07-05 (×7): qty 1

## 2012-07-05 NOTE — H&P (Signed)
Psychiatric Admission Assessment Adult  Patient Identification:  Carrie Hammond  Date of Evaluation:  07/05/2012  Chief Complaint:  Depressive Disorder NOS 311 Opioid Dependence 304.00  History of Present Illness: This is a 37 year old Caucasian female. Admitted to [facility] from the [facility] hospital with complaints of drug intoxication requesting detox. Patient reports, "I went to the [facility] on Friday. I needed detoxification treatment for heroin. I have been using heavily since February of this year. Also, I was smoking weed sometimes. Apparently, I am not happy with my life. Heroin makes my sad feeling about my life go away. I don't really know what is it that is not so good about my life or why I'm so sad with my life. I can't function if I did not use heroin. I have a full time job. I have been a drug addict for 20 years. I was at a time sober x 5 years, then relapsed. I have been in and out of drug rehab most of my adult life. I am not suicidal or was I suicidal when I entered the hospital. I lied that I took an overdose to kill myself so that I will get a bed to come here. I can't go through the withdrawal symptoms of heroin on my own at home. It hurts".  Elements:  Location:  [facility] adult unit. Quality:  Increased and daily heroin use. Severity:  Severe. Timing:  I have been using on daily basis x 4-6 months. Duration:  "I have been a dug addict x 20 years". Context:  Increased guilt feelings and feeling of disappointment.  Associated Signs/Synptoms:  Depression Symptoms:  depressed mood, feelings of worthlessness/guilt, hopelessness, insomnia,  (Hypo) Manic Symptoms:  Irritable Mood,  Anxiety Symptoms:  Excessive Worry,  Psychotic Symptoms:  Hallucinations: Denies  PTSD Symptoms: Had a traumatic exposure:  Denies  Psychiatric Specialty Exam: Physical Exam  Constitutional: She is oriented to person, place, and time. She appears well-developed and well-nourished.   HENT:  Head: Normocephalic.  Eyes: Pupils are equal, round, and reactive to light.  Neck: Normal range of motion.  Cardiovascular: Normal rate.   Respiratory: Effort normal.  GI: Soft.  Musculoskeletal: Normal range of motion.  Neurological: She is alert and oriented to person, place, and time.  Skin: Skin is warm and dry.  Psychiatric: Her speech is normal. Thought content normal. Her mood appears anxious (Rated at #10). She is agitated. Cognition and memory are normal. She expresses impulsivity. She exhibits a depressed mood (rated at #6).    Review of Systems  Constitutional: Positive for chills, malaise/fatigue and diaphoresis.  HENT: Negative.   Eyes: Negative.   Respiratory: Negative.   Cardiovascular: Negative.   Gastrointestinal: Positive for nausea.  Genitourinary: Negative.   Musculoskeletal: Positive for myalgias.  Skin: Negative.   Neurological: Positive for tremors and weakness.  Psychiatric/Behavioral: Positive for depression and substance abuse (Opiate abuse). Negative for suicidal ideas (rated at #6), hallucinations and memory loss. The patient is nervous/anxious (Rated at #10). The patient does not have insomnia.     Blood pressure 117/76, pulse 71, temperature 97.9 F (36.6 C), temperature source Oral, resp. rate 16, height 5\' 6"  (1.676 m), weight 58.514 kg (129 lb), last menstrual period 07/04/2012, SpO2 99.00%.Body mass index is 20.83 kg/(m^2).  General Appearance: Disheveled  Eye Solicitor::  Fair  Speech:  Clear and Coherent  Volume:  Normal  Mood:  Anxious, Depressed and Irritable  Affect:  Flat  Thought Process:  Coherent and  Goal Directed  Orientation:  Full (Time, Place, and Person)  Thought Content:  Rumination  Suicidal Thoughts:  No, "I lied at ED that I was suicidal and that I took an overdose pills so that I can get a bed here"  Homicidal Thoughts:  No  Memory:  Immediate;   Good Recent;   Good Remote;   Good  Judgement:  Good  Insight:  Good   Psychomotor Activity:  Restlessness and Tremor  Concentration:  Fair  Recall:  Good  Akathisia:  No  Handed:  Right  AIMS (if indicated):     Assets:  Desire for Improvement  Sleep:  Number of Hours: 5.75    Past Psychiatric History: Diagnosis: Opioid dependence  Hospitalizations: [facility]  Outpatient Care: None reported  Substance Abuse Care: None reported  Self-Mutilation: Denies  Suicidal Attempts: Denies attempts and or thoughts, admits lying to be suicidal and having attempted suicide by overdose to get a bed here.  Violent Behaviors: None reported   Past Medical History:   Past Medical History  Diagnosis Date  . Endometriosis   . Gonorrhea    None.  Allergies:  No Known Allergies  PTA Medications: No prescriptions prior to admission    Previous Psychotropic Medications:  Medication/Dose  See medication lists               Substance Abuse History in the last 12 months:  yes  Consequences of Substance Abuse: Medical Consequences:  Liver damage, Possible death by overdose Legal Consequences:  Arrests, jail time, Loss of driving privilege. Family Consequences:  Family discord, divorce and or separation.  Social History:  reports that she has been smoking.  She does not have any smokeless tobacco history on file. She reports that she uses illicit drugs. She reports that she does not drink alcohol. Additional Social History: Pain Medications: hx of being on methadone, opana, and tramadol when heroine unavailable.  History of alcohol / drug use?: Yes Longest period of sobriety (when/how long): 1.5 hrs completely clean - 4 years on methadone Negative Consequences of Use:  (none) Withdrawal Symptoms:  (Agitation;Diarrhea;Anorexia;depression - has been an issues ) Name of Substance 1: heroine 1 - Age of First Use: 16 1 - Amount (size/oz): one bag per week 1 - Frequency: weekly 1 - Duration: 21 years 1 - Last Use / Amount: yesterday  Current Place of  Residence: [place], [place]  Place of Birth: [place]   Family Members: "My 2 girls"  Marital Status:  Single  Children: 2  Sons: 1  Daughters: 1  Relationships: Single  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  Legal History: None reported  Hobbies/Interests: None reported  Family History:  History reviewed. No pertinent family history.  Results for orders placed during the hospital encounter of 07/03/12 (from the past 72 hour(s))  CBC     Status: Abnormal   Collection Time    07/03/12  5:39 PM      Result Value Range   WBC 12.4 (*) 4.0 - 10.5 K/uL   RBC 4.77  3.87 - 5.11 MIL/uL   Hemoglobin 14.3  12.0 - 15.0 g/dL   HCT 16.1  09.6 - 04.5 %   MCV 85.5  78.0 - 100.0 fL   MCH 30.0  26.0 - 34.0 pg   MCHC 35.0  30.0 - 36.0 g/dL   RDW 40.9  11.5 - 15.5 %   Platelets 283  150 - 400 K/uL  COMPREHENSIVE METABOLIC PANEL     Status: None   Collection Time    07/03/12  5:39 PM      Result Value Range   Sodium 140  135 - 145 mEq/L   Potassium 3.5  3.5 - 5.1 mEq/L   Chloride 103  96 - 112 mEq/L   CO2 25  19 - 32 mEq/L   Glucose, Bld 91  70 - 99 mg/dL   BUN 9  6 - 23 mg/dL   Creatinine, Ser 4.09  0.50 - 1.10 mg/dL   Calcium 8.9  8.4 - 81.1 mg/dL   Total Protein 7.6  6.0 - 8.3 g/dL   Albumin 4.2  3.5 - 5.2 g/dL   AST 17  0 - 37 U/L   ALT 11  0 - 35 U/L   Alkaline Phosphatase 48  39 - 117 U/L   Total Bilirubin 0.3  0.3 - 1.2 mg/dL   GFR calc non Af Amer >90  >90 mL/min   GFR calc Af Amer >90  >90 mL/min   Comment:            The eGFR has been calculated     using the CKD EPI equation.     This calculation has not been     validated in all clinical     situations.     eGFR's persistently     <90 mL/min signify     possible Chronic Kidney Disease.  ETHANOL     Status: None    Collection Time    07/03/12  5:39 PM      Result Value Range   Alcohol, Ethyl (B) <11  0 - 11 mg/dL   Comment:            LOWEST DETECTABLE LIMIT FOR     SERUM ALCOHOL IS 11 mg/dL     FOR MEDICAL PURPOSES ONLY  ACETAMINOPHEN LEVEL     Status: None   Collection Time    07/03/12  5:39 PM      Result Value Range   Acetaminophen (Tylenol), Serum <15.0  10 - 30 ug/mL   Comment:            THERAPEUTIC CONCENTRATIONS VARY     SIGNIFICANTLY. A RANGE OF 10-30     ug/mL MAY BE AN EFFECTIVE     CONCENTRATION FOR MANY PATIENTS.     HOWEVER, SOME ARE BEST TREATED     AT CONCENTRATIONS OUTSIDE THIS     RANGE.     ACETAMINOPHEN CONCENTRATIONS     >150 ug/mL AT 4 HOURS AFTER     INGESTION AND >50 ug/mL AT 12     HOURS AFTER INGESTION ARE     OFTEN ASSOCIATED WITH TOXIC     REACTIONS.  SALICYLATE LEVEL     Status: Abnormal   Collection Time    07/03/12  5:39 PM      Result Value Range   Salicylate Lvl <2.0 (*) 2.8 - 20.0 mg/dL  URINE RAPID DRUG SCREEN (HOSP PERFORMED)     Status: Abnormal   Collection Time    07/03/12  8:24 PM      Result Value Range   Opiates POSITIVE (*) NONE DETECTED   Cocaine NONE DETECTED  NONE DETECTED   Benzodiazepines NONE DETECTED  NONE DETECTED   Amphetamines NONE DETECTED  NONE DETECTED   Tetrahydrocannabinol POSITIVE (*) NONE DETECTED  Barbiturates NONE DETECTED  NONE DETECTED   Comment:            DRUG SCREEN FOR MEDICAL PURPOSES     ONLY.  IF CONFIRMATION IS NEEDED     FOR ANY PURPOSE, NOTIFY LAB     WITHIN 5 DAYS.                LOWEST DETECTABLE LIMITS     FOR URINE DRUG SCREEN     Drug Class       Cutoff (ng/mL)     Amphetamine      1000     Barbiturate      200     Benzodiazepine   200     Tricyclics       300     Opiates          300     Cocaine          300     THC              50  POCT PREGNANCY, URINE     Status: None   Collection Time    07/03/12  8:36 PM      Result Value Range   Preg Test, Ur NEGATIVE  NEGATIVE   Comment:             THE SENSITIVITY OF THIS     METHODOLOGY IS >24 mIU/mL   Psychological Evaluations:  Assessment:   AXIS I:  Opioid dependence AXIS II:  Deferred AXIS III:   Past Medical History  Diagnosis Date  . Endometriosis   . Gonorrhea    AXIS IV:  other psychosocial or environmental problems AXIS V:  1-10 persistent dangerousness to self and others present  Treatment Plan/Recommendations: 1. Admit for crisis management and stabilization, estimated length of stay 3-5 days.  2. Medication management to reduce current symptoms to base line and improve the patient's overall level of functioning  3. Treat health problems as indicated.  4. Develop treatment plan to decrease risk of relapse upon discharge and the need for readmission.  5. Psycho-social education regarding relapse prevention and self care.  6. Health care follow up as needed for medical problems.  7. Review, reconcile, and reinstate any pertinent home medications for other health issues where appropriate. 8. Call for consults with hospitalist for any additional specialty patient care services as needed.  Treatment Plan Summary: Daily contact with patient to assess and evaluate symptoms and progress in treatment Medication management Supportive approach/coping skills/relapse prevention Detox/reassess and address the co morbidities Current Medications:  Current Facility-Administered Medications  Medication Dose Route Frequency Provider Last Rate Last Dose  . acetaminophen (TYLENOL) tablet 650 mg  650 mg Oral Q6H PRN Mike Craze, MD   650 mg at 07/05/12 0659  . alum & mag hydroxide-simeth (MAALOX/MYLANTA) 200-200-20 MG/5ML suspension 30 mL  30 mL Oral Q4H PRN Mike Craze, MD      . cloNIDine (CATAPRES) tablet 0.1 mg  0.1 mg Oral QID Mike Craze, MD   0.1 mg at 07/05/12 0700   Followed by  . [START ON 07/07/2012] cloNIDine (CATAPRES) tablet 0.1 mg  0.1 mg Oral BH-qamhs Mike Craze, MD       Followed by  . [START ON  07/10/2012] cloNIDine (CATAPRES) tablet 0.1 mg  0.1 mg Oral QAC breakfast Mike Craze, MD      . dicyclomine (BENTYL) tablet 20 mg  20 mg Oral Q6H PRN  Mike Craze, MD      . hydrOXYzine (ATARAX/VISTARIL) tablet 25 mg  25 mg Oral Q6H PRN Mike Craze, MD      . loperamide (IMODIUM) capsule 2-4 mg  2-4 mg Oral PRN Mike Craze, MD      . magnesium hydroxide (MILK OF MAGNESIA) suspension 30 mL  30 mL Oral Daily PRN Mike Craze, MD      . methocarbamol (ROBAXIN) tablet 500 mg  500 mg Oral Q8H PRN Mike Craze, MD   500 mg at 07/04/12 1731  . naproxen (NAPROSYN) tablet 500 mg  500 mg Oral BID PRN Mike Craze, MD   500 mg at 07/04/12 1731  . ondansetron (ZOFRAN-ODT) disintegrating tablet 4 mg  4 mg Oral Q6H PRN Mike Craze, MD        Observation Level/Precautions:  15 minute checks  Laboratory:  Reviewed ED lab findings on file  Psychotherapy:  Group sessions  Medications: See medication lists    Consultations: As needed   Discharge Concerns:   Safety/sobriety  Estimated LOS: 3-5 days  Other:     I certify that inpatient services furnished can reasonably be expected to improve the patient's condition.   Armandina Stammer I 6/29/201410:11 AM  Agree with assessment and plan Reymundo Poll. Dub Mikes, M.D.

## 2012-07-05 NOTE — Progress Notes (Addendum)
Nutrition Brief Note  Patient identified on the Malnutrition Screening Tool (MST) Report  Body mass index is 20.83 kg/(m^2). Patient meets criteria for wnl based on current BMI.   Patient reports currently maintaining weight  At 129-130 lbs.  Does not want to gain weight.  Works second shift and does not typically eat lunch.  Will not eat breakfast or lunch here regularly.  Is eating snacks bid and dinner.    Current diet order is regular, patient is consuming approximately fair% of meals at this time. Labs and medications reviewed.   Encouraged intake of meals and snacks and will add MVI daily.  Discussed with RN.  No nutrition interventions warranted at this time. If nutrition issues arise, please consult RD.   Oran Rein, RD, LDN Clinical Inpatient Dietitian Pager:  [phone] Weekend and after hours pager:  [phone]

## 2012-07-05 NOTE — Progress Notes (Signed)
D. Pt. Has flat affect, anxious mood.  Pt. Has been in bed sleeping this morning.  Denies SI/HI and denies A/V hallucinations. A.  Pt. Encouraged to attend groups. R.  Pt. Receptive.

## 2012-07-05 NOTE — Progress Notes (Signed)
Patient did attend the evening speaker AA meeting.  

## 2012-07-05 NOTE — Progress Notes (Signed)
Psychoeducational Group Note  Date:  07/05/2012 Time:  0945 am  Group Topic/Focus:  Making Healthy Choices:   The focus of this group is to help patients identify negative/unhealthy choices they were using prior to admission and identify positive/healthier coping strategies to replace them upon discharge.  Participation Level:  Did Not Attend   Kahlea Cobert J 07/05/2012, 10:29 AM 

## 2012-07-05 NOTE — Progress Notes (Signed)
Psychoeducational Group Note  Date:  07/05/2012 Time:  0945 am  Group Topic/Focus:  Making Healthy Choices:   The focus of this group is to help patients identify negative/unhealthy choices they were using prior to admission and identify positive/healthier coping strategies to replace them upon discharge.  Participation Level:  Did Not Attend   Asmi Fugere J 07/05/2012, 10:29 AM 

## 2012-07-05 NOTE — [facility] Group Notes (Signed)
[facility] Group Notes: (Clinical Social Work)   07/05/2012      Type of Therapy:  Group Therapy   Participation Level:  Did Not Attend    Ambrose Mantle, LCSW 07/05/2012, 11:16 AM

## 2012-07-05 NOTE — Progress Notes (Signed)
Patient ID: Carrie Hammond, female   DOB: April 17, 1975, 37 y.o.   MRN: [id number] D)  Was out and about on hall, attended group, interacting with peers appropriately came to med window afterward to request clonidine.  Cooperative but pleasant, anxious, was medicated and given fluids.  Went back to dayroom to watch a game of cards before going to bed. A)  Will continue to monitor for safety, continue POC R)  Safety maintained.

## 2012-07-06 DIAGNOSIS — F1120 Opioid dependence, uncomplicated: Principal | ICD-10-CM

## 2012-07-06 MED ORDER — TRAZODONE HCL 50 MG PO TABS
50.0000 mg | ORAL_TABLET | Freq: Every evening | ORAL | Status: DC | PRN
Start: 2012-07-06 — End: 2012-07-07
  Administered 2012-07-06 (×2): 50 mg via ORAL
  Filled 2012-07-06 (×2): qty 1
  Filled 2012-07-06 (×2): qty 28
  Filled 2012-07-06 (×3): qty 1

## 2012-07-06 NOTE — [facility] Suicide Risk Assessment (Signed)
[facility] Adult Inpatient Family/Significant Other Suicide Prevention Education  Suicide Prevention Education:   Patient Refusal for Family/Significant Other Suicide Prevention Education: The patient has refused to provide written consent for family/significant other to be provided Family/Significant Other Suicide Prevention Education during admission and/or prior to discharge.  Physician notified.  CSW provided suicide prevention information with patient.    The suicide prevention education provided includes the following:  Suicide risk factors  Suicide prevention and interventions  National Suicide Hotline telephone number  [facility] assessment telephone number  [facility] Emergency Assistance 911  [facility] and/or Residential Mobile Crisis Unit telephone number   Carrie Hammond, [place] 07/06/2012 2:27 PM

## 2012-07-06 NOTE — Progress Notes (Signed)
Patient ID: Carrie Hammond, female   DOB: 12-21-1975, 37 y.o.   MRN: [id number] She has been in bed in the Am up at lunch and to meals. Has been to groups this afternoon . Has requested and received prn Imodium at 14:32. She denies having any other withdrawal symptome. Stated"  This is the easiest  withdrawal I have ever had."

## 2012-07-06 NOTE — [facility] Group Notes (Signed)
[facility] LCSW Group Therapy  07/06/2012  1:15 PM   Type of Therapy:  Group Therapy  Participation Level:  Active  Participation Quality:  Appropriate and Attentive  Affect:  Appropriate, Irritable  Cognitive:  Alert and Appropriate  Insight:  Developing/Improving and Engaged  Engagement in Therapy:  Developing/Improving and Engaged  Modes of Intervention:  Clarification, Confrontation, Discussion, Education, Exploration, Limit-setting, Orientation, Problem-solving, Rapport Building, Dance movement psychotherapist, Socialization and Support  Summary of Progress/Problems: Pt identified obstacles faced currently and processed barriers involved in overcoming these obstacles. Pt identified steps necessary for overcoming these obstacles and explored motivation (internal and external) for facing these difficulties head on. Pt further identified one area of concern in their lives and chose a goal to focus on for today.  Pt was initially agitated about the fire drill and how it was handled as well as when she will be able to d/c.  Pt was able to vent about these issues and was then able to calm down and participate appropriately in group.  Pt shared that she relapsed on heroin after being clean for a long period of time.  Pt states that she plans to move on and not beat herself up about this relapse.  Pt states that her obstacle she is overcoming is the guilt for missing work, as pt states people depend on her.  Pt was able to process these feelings of guilt, both around relapse and work.    Reyes Ivan, LCSWA 07/06/2012 2:42 PM

## 2012-07-06 NOTE — Progress Notes (Signed)
Adult Psychoeducational Group Note  Date:  07/06/2012 Time:  12:01 PM  Group Topic/Focus:  Wellness Toolbox:   The focus of this group is to discuss various aspects of wellness, balancing those aspects and exploring ways to increase the ability to experience wellness.  Patients will create a wellness toolbox for use upon discharge.  Participation Level:  Minimal  Participation Quality:  Appropriate  Affect:  Appropriate  Cognitive:  Appropriate  Insight: Appropriate  Engagement in Group:  Improving  Modes of Intervention:  Discussion  Additional Comments:  Pt was appropriate and sharing while attending group. Pt attend group toward the end but provided encouragement to other pt's.   Sharyn Lull 07/06/2012, 12:01 PM

## 2012-07-06 NOTE — [facility] Group Notes (Signed)
[facility] LCSW Aftercare Discharge Planning Group Note   07/06/2012 8:45 AM  Participation Quality:  Did Not Attend  Carrie Hammond, LCSWA 07/06/2012 9:44 AM

## 2012-07-06 NOTE — Progress Notes (Signed)
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 Admits that when she came here she was completely overwhelmed and having suicidal ideas. She states that she can see things clearly now. The relationship is over. She states that she knows what she needs to do. She does not feel she needs to go to rehab. She says she only needs to do what she knows to do to regain sobriety Diagnosis:  Opiate Dependence  ADL's:  Intact  Sleep: Fair  Appetite:  Fair  Suicidal Ideation:  Plan:  denies Intent:  denies Means:  denies Homicidal Ideation:  Plan:  denies Intent:  denies Means:  denies AEB (as evidenced by):  Psychiatric Specialty Exam: Review of Systems  Constitutional: Negative.   HENT: Negative.   Eyes: Negative.   Respiratory: Negative.   Cardiovascular: Negative.   Gastrointestinal: Negative.   Genitourinary: Negative.   Musculoskeletal: Negative.   Skin: Negative.   Neurological: Negative.   Endo/Heme/Allergies: Negative.   Psychiatric/Behavioral: Positive for substance abuse. The patient is nervous/anxious.     Blood pressure 123/78, pulse 54, temperature 97.8 F (36.6 C), temperature source Oral, resp. rate 16, height 5\' 6"  (1.676 m), weight 58.514 kg (129 lb), last menstrual period 07/04/2012, SpO2 99.00%.Body mass index is 20.83 kg/(m^2).  General Appearance: Fairly Groomed  Patent attorney::  Fair  Speech:  Clear and Coherent  Volume:  Normal  Mood:  Anxious and worried  Affect:  anxious  Thought Process:  Coherent and Goal Directed  Orientation:  Full (Time, Place, and Person)  Thought Content:  worries, concerns, regrets  Suicidal Thoughts:  Not currently admits to fleeting thoughts  Homicidal Thoughts:  No  Memory:  Immediate;   Fair Recent;   Fair Remote;   Fair  Judgement:  Fair   Insight:  Present  Psychomotor Activity:  Restlessness  Concentration:  Fair  Recall:  Fair  Akathisia:  No  Handed:  Right  AIMS (if indicated):     Assets:  Desire for Improvement Housing  Sleep:  Number of Hours: 5.25   Current Medications: Current Facility-Administered Medications  Medication Dose Route Frequency Provider Last Rate Last Dose  . acetaminophen (TYLENOL) tablet 650 mg  650 mg Oral Q6H PRN Mike Craze, MD   650 mg at 07/05/12 0659  . alum & mag hydroxide-simeth (MAALOX/MYLANTA) 200-200-20 MG/5ML suspension 30 mL  30 mL Oral Q4H PRN Mike Craze, MD      . cloNIDine (CATAPRES) tablet 0.1 mg  0.1 mg Oral QID Mike Craze, MD   0.1 mg at 07/06/12 1132   Followed by  . [START ON 07/07/2012] cloNIDine (CATAPRES) tablet 0.1 mg  0.1 mg Oral BH-qamhs Mike Craze, MD       Followed by  . [START ON 07/10/2012] cloNIDine (CATAPRES) tablet 0.1 mg  0.1 mg Oral QAC breakfast Mike Craze, MD      . dicyclomine (BENTYL) tablet 20 mg  20 mg Oral Q6H PRN Mike Craze, MD      . hydrOXYzine (ATARAX/VISTARIL) tablet 25 mg  25 mg Oral Q6H PRN Mike Craze, MD   25 mg at 07/06/12 0329  . loperamide (IMODIUM) capsule 2-4 mg  2-4 mg Oral PRN Mike Craze, MD   4 mg at 07/06/12 1431  . magnesium hydroxide (MILK  OF MAGNESIA) suspension 30 mL  30 mL Oral Daily PRN Mike Craze, MD      . methocarbamol (ROBAXIN) tablet 500 mg  500 mg Oral Q8H PRN Mike Craze, MD   500 mg at 07/04/12 1731  . multivitamin with minerals tablet 1 tablet  1 tablet Oral Daily Jeoffrey Massed, RD   1 tablet at 07/06/12 1132  . naproxen (NAPROSYN) tablet 500 mg  500 mg Oral BID PRN Mike Craze, MD   500 mg at 07/04/12 1731  . nicotine (NICODERM CQ - dosed in mg/24 hours) patch 21 mg  21 mg Transdermal Daily Rachael Fee, MD   21 mg at 07/06/12 1133  . ondansetron (ZOFRAN-ODT) disintegrating tablet 4 mg  4 mg Oral Q6H PRN Mike Craze, MD        Lab Results: No results found for this or any  previous visit (from the past 48 hour(s)).  Physical Findings: AIMS: Facial and Oral Movements Muscles of Facial Expression: None, normal Lips and Perioral Area: None, normal Tongue: None, normal,Extremity Movements Upper (arms, wrists, hands, fingers): None, normal Lower (legs, knees, ankles, toes): None, normal, Trunk Movements Neck, shoulders, hips: None, normal, Overall Severity Severity of abnormal movements (highest score from questions above): None, normal Incapacitation due to abnormal movements: None, normal Patient's awareness of abnormal movements (rate only patient's report): No Awareness, Dental Status Current problems with teeth and/or dentures?: No Does patient usually wear dentures?: No  CIWA:  CIWA-Ar Total: 0 COWS:  COWS Total Score: 4  Treatment Plan Summary: Daily contact with patient to assess and evaluate symptoms and progress in treatment Medication management  Plan: Supportive approach/coping skills/relapse prevention           Continue detox           Reassess and address the co morbidities  Medical Decision Making Problem Points:  Review of psycho-social stressors (1) Data Points:  Review of medication regiment & side effects (2)  I certify that inpatient services furnished can reasonably be expected to improve the patient's condition.   Nishika Parkhurst A 07/06/2012, 5:04 PM

## 2012-07-06 NOTE — Tx Team (Signed)
Interdisciplinary Treatment Plan Update (Adult)  Date: 07/06/2012  Time Reviewed:  9:45 AM  Progress in Treatment: Attending groups: Yes Participating in groups:  Yes Taking medication as prescribed:  Yes Tolerating medication:  Yes Family/Significant othe contact made: CSW assessing Patient understands diagnosis:  Yes Discussing patient identified problems/goals with staff:  Yes Medical problems stabilized or resolved:  Yes Denies suicidal/homicidal ideation: Yes Issues/concerns per patient self-inventory:  Yes Other:  New problem(s) identified: N/A  Discharge Plan or Barriers: CSW assessing for appropriate referrals.    Reason for Continuation of Hospitalization: Anxiety Depression Medication Stabilization  Comments: N/A  Estimated length of stay: 3-5 days  For review of initial/current patient goals, please see plan of care.  Attendees: Patient:     Family:     Physician:  Dr. Dub Mikes 07/06/2012 10:13 AM   Nursing:   Roswell Miners, RN 07/06/2012 10:13 AM   Clinical Social Worker:  Reyes Ivan, LCSWA 07/06/2012 10:13 AM   Other: Robbie Louis, RN 07/06/2012 10:13 AM   Other:  Trula Slade, LCSWA 07/06/2012 10:13 AM   Other:  Serena Colonel, NP 07/06/2012 10:13 AM   Other:     Other:    Other:    Other:    Other:    Other:    Other:     Scribe for Treatment Team:   Carmina Miller, 07/06/2012 , 10:13 AM

## 2012-07-06 NOTE — Progress Notes (Signed)
Patient ID: Kearney Hard, female   DOB: March 22, 1975, 37 y.o.   MRN: [id number]  D: Pt denies SI/HI/AVH/pain. Pt is pleasant and cooperative.  A: Pt was offered support and encouragement. Pt was given scheduled medications. Pt was encourage to attend groups. Q 15 minute checks were done for safety.   R:Pt attends groups and interacts well with peers and staff. Pt is taking medication. Pt has no complaints at this time.Pt receptive to treatment and safety maintained on unit.

## 2012-07-06 NOTE — Progress Notes (Signed)
Patient ID: Carrie Hammond, female   DOB: 1975-08-23, 37 y.o.   MRN: [id number] D)  Has been out in the dayroom this evening, feeling better, interacting with select peers, states is trying to encourage others.  States feels so much better, states this is the lightest, easiest detox she has ever gone through, realizes it should have never happened, verbalizing her plans for attending AA meetings after discharge and maintaining her sobriety, considering going back to school.  Pleasant, appropriate, focused, insightful.  Stated realizes she knows herself well enough to know she needs to be stronger, but hadn't realized how many people cared about her and wanted to help her, doesn't want to disappoint them or herself.  Wants to make it a goal to be able to help others who are going through what she has. A)  Will continue to monitor for safety, support and encouragement, continue POC R)  Safety maintained.

## 2012-07-07 MED ORDER — HYDROXYZINE HCL 25 MG PO TABS: 25.0000 mg | ORAL_TABLET | Freq: Four times a day (QID) | ORAL | Status: DC | PRN

## 2012-07-07 MED ORDER — TRAZODONE HCL 50 MG PO TABS: 50.0000 mg | ORAL_TABLET | Freq: Every evening | ORAL | Status: DC | PRN

## 2012-07-07 NOTE — Progress Notes (Signed)
Patient ID: Carrie Hammond, female   DOB: 05/12/1975, 37 y.o.   MRN: [id number] She has been discharged home. She voiced understanding of discharge instruction and of follow up plan. She denies thoughts of SI . Stated that  her stay here was great and that she plans to continue with her recovery. All belongings taken home with her.

## 2012-07-07 NOTE — Progress Notes (Signed)
[facility] Adult Case Management Discharge Plan :  Will you be returning to the same living situation after discharge: Yes,  returning home At discharge, do you have transportation home?:Yes,  access to transportation Do you have the ability to pay for your medications:Yes,  access to meds  Release of information consent forms completed and in the chart;  Patient's signature needed at discharge.  Patient to Follow up at: Follow-up Information   Follow up with [facility] of the [place] On 07/09/2012. (Walk in on this date.  Walk in clinic Monday - Friday, 8 am - 3 pm.  Request SAIOP.)    Contact information:   [address] [place] [address] Phone: [phone] Fax: [phone]      Patient denies SI/HI:   Yes,  denies Si/HI    Safety Planning and Suicide Prevention discussed:  Yes,  discussed with pt.  Pt refused contact with family/friends.  See suicide prevention note.    Horton, Salome Arnt 07/07/2012, 10:00 AM

## 2012-07-07 NOTE — BHH Suicide Risk Assessment (Signed)
Suicide Risk Assessment  Discharge Assessment     Demographic Factors:  Caucasian  Mental Status Per Nursing Assessment::   On Admission:  Self-harm thoughts  Current Mental Status by Physician: In full contact with reality. There are no suicidal ideas, plans or intent. Her mood is euthymic, her affect is appropriate. She is willing and motivated to pursue outpatient therapy. She is committed to regain long term abstinence   Loss Factors: Loss of significant relationship  Historical Factors: NA  Risk Reduction Factors:   Sense of responsibility to family, Employed, Living with another person, especially a relative and Positive social support  Continued Clinical Symptoms:  Alcohol/Substance Abuse/Dependencies  Cognitive Features That Contribute To Risk: None identified   Suicide Risk:  Minimal: No identifiable suicidal ideation.  Patients presenting with no risk factors but with morbid ruminations; may be classified as minimal risk based on the severity of the depressive symptoms  Discharge Diagnoses:   AXIS I:  Opioid Dependence AXIS II:  Deferred AXIS III:   Past Medical History  Diagnosis Date  . Endometriosis   . Gonorrhea    AXIS IV:  other psychosocial or environmental problems AXIS V:  61-70 mild symptoms  Plan Of Care/Follow-up recommendations:  Activity:  as tolerated Diet:  regular Continue outpatient follow up Is patient on multiple antipsychotic therapies at discharge:  No   Has Patient had three or more failed trials of antipsychotic monotherapy by history:  No  Recommended Plan for Multiple Antipsychotic Therapies: N/A   Araseli Sherry A 07/07/2012, 12:45 PM

## 2012-07-07 NOTE — Discharge Summary (Signed)
Physician Discharge Summary Note  Patient:  Carrie Hammond is an 37 y.o., female MRN:  [id number] DOB:  [birth year] Patient phone:  [phone] (home)  Patient address:   [address] [place] [address],   Date of Admission:  07/04/2012 Date of Discharge: 07/07/12  Reason for Admission: Drug intoxication, needing detoxification treatment.  Discharge Diagnoses: Principal Problem:   Opiate dependence  Review of Systems  Constitutional: Negative.   HENT: Negative.   Eyes: Negative.   Respiratory: Negative.   Cardiovascular: Negative.   Gastrointestinal: Negative.   Musculoskeletal: Negative.   Skin: Negative.   Neurological: Negative.   Endo/Heme/Allergies: Negative.   Psychiatric/Behavioral: Positive for substance abuse (Opioid dependence). Negative for depression, suicidal ideas, hallucinations and memory loss. The patient is nervous/anxious (Stabilized with medication prior to discharge) and has insomnia (Stabilized with medication prior to discharge).    Axis Diagnosis:   AXIS I:  Opiate dependence AXIS II:  Deferred AXIS III:   Past Medical History  Diagnosis Date  . Endometriosis   . Gonorrhea    AXIS IV:  Substance dependence AXIS V:  64  Level of Care:  OP  Hospital Course: This is a 37 year old Caucasian female. Admitted to [facility] from the [facility] hospital with complaints of drug intoxication requesting detox. Patient reports, "I went to the [facility] on Friday. I needed detoxification treatment for heroin. I have been using heavily since February of this year. Also, I was smoking weed sometimes. Apparently, I am not happy with my life. Heroin makes my sad feeling about my life go away. I don't really know what is it that is not so good about my life or why I'm so sad with my life. I can't function if I did not use heroin. I have a full time job. I have been a drug addict for 20 years. I was at a time sober x 5 years, then relapsed. I have been in and  out of drug rehab most of my adult life. I am not suicidal or was I suicidal when I entered the hospital. I lied that I took an overdose to kill myself so that I will get a bed to come here. I can't go through the withdrawal symptoms of heroin on my own at home. It hurts".  Upon admission in this hospital, and after admission assessment/evaluation, it was determined based on the UDS/Toxicology report that [name] will need detoxification treatment protocol to stabilize her systems of drug intoxication and to combat the withdrawal symptoms of opiate as well. She was then started on clonidine detoxification treatment protocol for her opiate detoxification. She was also enrolled in group counseling sessions and activities where she was taught, counseled and learned coping skills that should help her after discharge to cope better with her symptoms and manage her substance abuse/dependency issues for a much sustained sobreity. She also was enrolled/participated in the AA/NA meetings being offered and held on this unit. She has or rather presented no previously existing and or identifiable medical conditions that required treatment and or monitoring. However, she was monitored closely for any potential problems that may arise as a result of and or during detoxification treatment. Patient tolerated her detoxification treatment without any significant adverse effects and or reactions reported and or presented.  Patient attended treatment team meeting this am and met with the treatment team taem members. Her reason for admission, present symptoms, substance abuse issues, response to treatment and discharge plans discussed. Patient endorsed  that she is doing well and stable for discharge to pursue the next phase of her substance abuse treatment. It was then agreed upon between patient and the team that she will be discharged to continue psychiatric care at the [facility] here in [place], [place]. She  is informed and instructed that this is a walk-in appointment between the hours of 08:00 am and 03:00 pm. The address, time, date and contact information for this clinic provided for patient in writing.   Upon discharge, patient adamantly denies suicidal, homicidal ideations, auditory, visual hallucinations, delusional thoughts, paranoia and or withdrawal symptoms. Patient left [facility] with all personal belongings in no apparent distress. She received 2 weeks worth supply samples of her [facility] discharge medications, including a 30 days worth prescriptions for all his discharge medications. Transportation per patient arrangement.  Consults:  psychiatry  Significant Diagnostic Studies:  labs: CBC with diff, CMP, UDS, Toxicology tests, U/A  Discharge Vitals:   Blood pressure 118/89, pulse 103, temperature 98.8 F (37.1 C), temperature source Oral, resp. rate 18, height 5\' 6"  (1.676 m), weight 58.514 kg (129 lb), last menstrual period 07/04/2012, SpO2 99.00%. Body mass index is 20.83 kg/(m^2). Lab Results:   No results found for this or any previous visit (from the past 72 hour(s)).  Physical Findings: AIMS: Facial and Oral Movements Muscles of Facial Expression: None, normal Lips and Perioral Area: None, normal Tongue: None, normal,Extremity Movements Upper (arms, wrists, hands, fingers): None, normal Lower (legs, knees, ankles, toes): None, normal, Trunk Movements Neck, shoulders, hips: None, normal, Overall Severity Severity of abnormal movements (highest score from questions above): None, normal Incapacitation due to abnormal movements: None, normal Patient's awareness of abnormal movements (rate only patient's report): No Awareness, Dental Status Current problems with teeth and/or dentures?: No Does patient usually wear dentures?: No  CIWA:  CIWA-Ar Total: 0 COWS:  COWS Total Score: 4  Psychiatric Specialty Exam: See Psychiatric Specialty Exam and Suicide Risk Assessment completed by  Attending Physician prior to discharge.  Discharge destination:  Home  Is patient on multiple antipsychotic therapies at discharge:  No   Has Patient had three or more failed trials of antipsychotic monotherapy by history:  No  Recommended Plan for Multiple Antipsychotic Therapies: NA     Medication List       Indication   hydrOXYzine 25 MG tablet  Commonly known as:  ATARAX/VISTARIL  Take 1 tablet (25 mg total) by mouth every 6 (six) hours as needed for anxiety.   Indication:  Anxiety associated with Organic Disease     traZODone 50 MG tablet  Commonly known as:  DESYREL  Take 1 tablet (50 mg total) by mouth at bedtime and may repeat dose one time if needed. For sleep   Indication:  Trouble Sleeping       Follow-up Information   Follow up with [facility] of the [place] On 07/09/2012. (Walk in on this date.  Walk in clinic Monday - Friday, 8 am - 3 pm.  Request SAIOP.)    Contact information:   [address] [place] [address] Phone: [phone] Fax: [phone]     Follow-up recommendations:  Activity:  As tolerated Diet: As recommended by your primary care doctor. Keep all scheduled follow-up appointments as recommended. Continue to work the relapse prevention plan Comments: Take all your medications as prescribed by your mental healthcare provider. Report any adverse effects and or reactions from your medicines to your outpatient provider promptly. Patient is instructed and cautioned  to not engage in alcohol and or illegal drug use while on prescription medicines. In the event of worsening symptoms, patient is instructed to call the crisis hotline, 911 and or go to the nearest ED for appropriate evaluation and treatment of symptoms. Follow-up with your primary care provider for your other medical issues, concerns and or health care needs.   Total Discharge Time:  Greater than 30 minutes.  Signed: Sanjuana Kava, FNP, PMHNP-BC 07/07/2012, 9:43 AM Agree  with assessment and plan Reymundo Poll. Dub Mikes, M.D.

## 2012-07-07 NOTE — Progress Notes (Signed)
Adult Psychoeducational Group Note  Date:  07/07/2012 Time:  12:08 PM  Group Topic/Focus:  Recovery Goals:   The focus of this group is to identify appropriate goals for recovery and establish a plan to achieve them.  Participation Level:  Active  Participation Quality:  Appropriate, Attentive and Sharing  Affect:  Appropriate  Cognitive:  Alert and Appropriate  Insight: Appropriate  Engagement in Group:  Engaged  Modes of Intervention:  Discussion  Additional Comments:  Pt was appropriate and sharing while attending group. Pt shared that recovery makes her think about her failures. Pt shared that fear and low self-esteem is that stands between her and recovery. Pt states" O hate change but know that it is necessary for recovery".  Sharyn Lull 07/07/2012, 12:08 PM

## 2012-07-07 NOTE — B[facility] Group Notes (Signed)
[facility] LCSW Aftercare Discharge Planning Group Note   07/07/2012 8:45 AM  Participation Quality:  Alert and Appropriate   Mood/Affect: Appropriate and Bright  Depression Rating:  0  Anxiety Rating:  0  Thoughts of Suicide: Pt denies SI/HI  Will you contract for safety?   Yes  Current AVH:  Pt denies  Plan for Discharge/Comments:  Pt attended discharge planning group and actively participated in group.  CSW provided pt with today's workbook.  Pt reports feeling stable to d/c today.  Pt states that she plans to return home in [place] and follow up with [facility] of the [place] for [place], medication management and therapy.  Appointment scheduled by CSW.  Pt also plans to attend AA/NA meetings.  No further needs voiced by pt at this time.    Transportation Means: Pt reports access to transportation  Supports: Pt states that her friends are supportive  Reyes Ivan, LCSWA 07/07/2012 9:57 AM

## 2012-07-09 NOTE — Progress Notes (Signed)
Patient Discharge Instructions:  After Visit Summary (AVS):   Faxed to:  07/09/12 Discharge Summary Note:   Faxed to:  07/09/12 Psychiatric Admission Assessment Note:   Faxed to:  07/09/12 Suicide Risk Assessment - Discharge Assessment:   Faxed to:  07/09/12 Faxed/Sent to the Next Level Care provider:  07/09/12 Faxed to [facility] @ [phone]  Jerelene Redden, 07/09/2012, 3:50 PM

## 2013-10-08 ENCOUNTER — Encounter (HOSPITAL_COMMUNITY): Payer: Self-pay | Admitting: Emergency Medicine

## 2013-10-08 DIAGNOSIS — Z8742 Personal history of other diseases of the female genital tract: Secondary | ICD-10-CM | POA: Diagnosis not present

## 2013-10-08 DIAGNOSIS — F11129 Opioid abuse with intoxication, unspecified: Secondary | ICD-10-CM | POA: Insufficient documentation

## 2013-10-08 DIAGNOSIS — F14129 Cocaine abuse with intoxication, unspecified: Secondary | ICD-10-CM | POA: Diagnosis not present

## 2013-10-08 DIAGNOSIS — Z8619 Personal history of other infectious and parasitic diseases: Secondary | ICD-10-CM | POA: Diagnosis not present

## 2013-10-08 DIAGNOSIS — F12129 Cannabis abuse with intoxication, unspecified: Secondary | ICD-10-CM | POA: Insufficient documentation

## 2013-10-08 DIAGNOSIS — Z720 Tobacco use: Secondary | ICD-10-CM | POA: Diagnosis not present

## 2013-10-08 LAB — CBC
HEMATOCRIT: 38.2 % (ref 36.0–46.0)
HEMOGLOBIN: 13.2 g/dL (ref 12.0–15.0)
MCH: 29.9 pg (ref 26.0–34.0)
MCHC: 34.6 g/dL (ref 30.0–36.0)
MCV: 86.6 fL (ref 78.0–100.0)
Platelets: 277 10*3/uL (ref 150–400)
RBC: 4.41 MIL/uL (ref 3.87–5.11)
RDW: 14.6 % (ref 11.5–15.5)
WBC: 7.9 10*3/uL (ref 4.0–10.5)

## 2013-10-08 LAB — COMPREHENSIVE METABOLIC PANEL
ALK PHOS: 61 U/L (ref 39–117)
ALT: 8 U/L (ref 0–35)
ANION GAP: 10 (ref 5–15)
AST: 19 U/L (ref 0–37)
Albumin: 3.6 g/dL (ref 3.5–5.2)
BUN: 6 mg/dL (ref 6–23)
CHLORIDE: 104 meq/L (ref 96–112)
CO2: 26 mEq/L (ref 19–32)
CREATININE: 0.68 mg/dL (ref 0.50–1.10)
Calcium: 8.6 mg/dL (ref 8.4–10.5)
GFR calc non Af Amer: >90 mL/min (ref >90)
GLUCOSE: 96 mg/dL (ref 70–99)
POTASSIUM: 3.8 meq/L (ref 3.7–5.3)
Sodium: 140 mEq/L (ref 137–147)
Total Protein: 7.3 g/dL (ref 6.0–8.3)

## 2013-10-08 LAB — ETHANOL

## 2013-10-08 LAB — SALICYLATE LEVEL: Salicylate Lvl: <2 mg/dL — ABNORMAL LOW (ref 2.8–20.0)

## 2013-10-08 LAB — ACETAMINOPHEN LEVEL

## 2013-10-08 NOTE — ED Notes (Signed)
Pt here from detox from heroine. States she has used last 20 years. Pt last used heroine two hours. Pt denies any complaint. STates "I feel fabulous." Pt denies SI/Hi. Calm, cooperative in triage.

## 2013-10-09 ENCOUNTER — Emergency Department (HOSPITAL_COMMUNITY)
Admission: EM | Admit: 2013-10-09 | Discharge: 2013-10-09 | Disposition: A | Discharge status: Home / Self Care | Payer: Commercial Managed Care - PPO | Attending: Emergency Medicine | Admitting: Emergency Medicine

## 2013-10-09 DIAGNOSIS — F1110 Opioid abuse, uncomplicated: Secondary | ICD-10-CM

## 2013-10-09 LAB — RAPID URINE DRUG SCREEN, HOSP PERFORMED
Amphetamines: NOT DETECTED
BARBITURATES: NOT DETECTED
Benzodiazepines: NOT DETECTED
Cocaine: POSITIVE — AB
Opiates: POSITIVE — AB
TETRAHYDROCANNABINOL: POSITIVE — AB

## 2013-10-09 NOTE — Discharge Instructions (Signed)
°Emergency Department Resource Guide °1) Find a Doctor and Pay Out of Pocket °Although you won't have to find out who is covered by your insurance plan, it is a good idea to ask around and get recommendations. You will then need to call the office and see if the doctor you have chosen will accept you as a new patient and what types of options they offer for patients who are self-pay. Some doctors offer discounts or will set up payment plans for their patients who do not have insurance, but you will need to ask so you aren't surprised when you get to your appointment. ° °2) Contact Your Local Health Department °Not all health departments have doctors that can see patients for sick visits, but many do, so it is worth a call to see if yours does. If you don't know where your local health department is, you can check in your phone book. The CDC also has a tool to help you locate your state's health department, and many state websites also have listings of all of their local health departments. ° °3) Find a Walk-in Clinic °If your illness is not likely to be very severe or complicated, you may want to try a walk in clinic. These are popping up all over the country in pharmacies, drugstores, and shopping centers. They're usually staffed by nurse practitioners or physician assistants that have been trained to treat common illnesses and complaints. They're usually fairly quick and inexpensive. However, if you have serious medical issues or chronic medical problems, these are probably not your best option. ° °No Primary Care Doctor: °- Call [facility] at  832-8000 - they can help you locate a primary care doctor that  accepts your insurance, provides certain services, etc. °- Physician Referral Service- 1-800-533-3463 ° °Chronic Pain Problems: °Organization         Address  Phone   Notes  °[facility]  (336) 297-2271 Patients need to be referred by their primary care doctor.  ° °Medication  Assistance: °Organization         Address  Phone   Notes  °[place] [facility] [address] °[place], [place] [address] (336) 641-8030 --Must be a resident of [place] °-- Must have NO insurance coverage whatsoever (no Medicaid/ Medicare, etc.) °-- The pt. MUST have a primary care doctor that directs their care regularly and follows them in the community °  °[facility]  (866) 331-1348   °[employer]  (888) 892-1162   ° °Agencies that provide inexpensive medical care: °Organization         Address  Phone   Notes  °[facility]  (336) 832-8035   °[facility]    (336) 832-7272   °[facility] [address] °[place], [place] [address] (336) 832-4777   °[facility] [address], °[place] (336) 271-4999   °[facility]    (336) 373-0678   °[facility]    (336) 272-1050   °[facility] ° [address], [place] Phone:  (336) 832-4444, Fax:  (336) 832-4440 Hours of Operation:  9 am - 6 pm, M-F.  Also accepts Medicaid/Medicare and self-pay.  °[facility] ° [address], [place] Phone: (336) 832-3150, Fax: (336) 832-3151. Hours of Operation:  8:30 am - 5:30 pm, M-F.  Also accepts Medicaid and self-pay.  °[facility] [place] [address], [place] Phone: (336) 878-6027   °[facility] [address], [place], [place] (336)723-1848, Ext. 123 Mondays & Thursdays: 7-9 AM.  First 15 patients are seen on a first come, first serve basis. °  ° °Medicaid-accepting [place] Providers: ° °Organization         Address  Phone   Notes  °[facility] [address], [place] (336) 641-2100 Also accepts self-pay patients.  °[facility] [address], [place] ° (336) 856-9996   °[facility] [address], [place]  (336) 288-8857   °[facility] [address] [place] [address], [place] (336) 299-7000   °Veita Bland [address], [place]  ° (336) 373-1557 Only accepts [place] Access Medicaid patients after they have their name applied to their card.  ° °Self-Pay (no insurance) in [place]: ° °Organization         Address  Phone   Notes  °Sickle Cell Patients, [facility] [address], [place] (336) 832-1970   °[facility] [address], [place] (336) 832-4400   °[facility] ° [address],  (336) 992-4800   °[facility]/Dr. Osei-Bonsu ° [address] [place] [address], [place] or [address], [place] (336) 841-8500 Phone number for both [place] and [place] locations is the same.  °[facility] [address], [place] (336) 299-0000   °[facility] [address] [place] [address], [place] or [address] (336) 852-7530 °(336) 878-2260   °[facility] [address], [place] (336) 350-1642, phone; (336) 294-5005, fax Sees patients 1st and 3rd Saturday of every month.  Must not qualify for public or private insurance (i.e. Medicaid, Medicare, [place] Health Choice, Veterans' Benefits) • Household income should be no more than 200% of the poverty level •The clinic cannot treat you if you are pregnant or think you are pregnant • Sexually transmitted diseases are not treated at the clinic.  ° ° °Dental Care: °Organization         Address  Phone  Notes  °[place] [facility] [address], [place] (336) 641-6152 Accepts children up to age 21 who are enrolled in Medicaid or [place] Health Choice; pregnant women with a Medicaid card; and children who have applied for Medicaid or [place] Health Choice, but were declined, whose parents can pay a reduced fee at time of service.  °[place]  [facility] [place]  [address], [place] (336) 641-7733 Accepts children up to age 21 who are enrolled in Medicaid or [place] Health Choice; pregnant women with a Medicaid card; and children who have applied for Medicaid or [place] Health Choice, but were declined, whose parents can pay a reduced fee at time of service.  °[facility] ° [address], [place] (336) 641-4533 Patients are seen by appointment only. Walk-ins are not accepted. [facility] will see patients 18 years of age and older. °Monday - Tuesday (8am-5pm) °Most Wednesdays (8:30-5pm) °$30 per visit, cash only  °[facility] ° [address], [place] (336) 641-4533 Patients are seen by appointment only. Walk-ins are not accepted. [facility] will see patients 18 years of age and older. °One   Wednesday Evening (Monthly: Volunteer Based).  $30 per visit, cash only  °[facility]  (919) 537-3737 for adults; Children under age 4, call Graduate Pediatric Dentistry at (919) [phone]. Children aged 4-14, please call (919) [phone] to request a pediatric application. ° Dental services are provided in all areas of dental care including fillings, crowns and bridges, complete and partial dentures, implants, gum treatment, root canals, and extractions. Preventive care is also provided. Treatment is provided to both adults and children. °Patients are selected via a lottery and there is often a waiting list. °  °[facility] [address], °[place] ° (336) 763-8833 [email or web address] °  °[facility] [address], [place], [place] (336)723-1848, Ext. 123 Second and Fourth Thursday of each month, opens at 6:30 AM; Clinic ends at 9 AM.  Patients are seen on a first-come first-served basis, and a limited number are seen during each clinic.  ° °[facility] ° [address], [place], [place] (336) 723-7904    Eligibility Requirements °You must have lived in [place], [place], or [place] counties for at least the last three months. °  You cannot be eligible for state or federal sponsored healthcare insurance, including Veterans Administration, Medicaid, or Medicare. °  You generally cannot be eligible for healthcare insurance through your employer.  °  How to apply: °Eligibility screenings are held every Tuesday and Wednesday afternoon from 1:00 pm until 4:00 pm. You do not need an appointment for the interview!  °[facility] [address], [place], [place] 336-631-2330   °[facility]  336-342-8273   °[facility]  336-703-3100   °[facility]  336-570-6415   ° °Behavioral Health Resources in the Community: °Intensive Outpatient Programs °Organization         Address  Phone  Notes  °[facility] [address], [place], [place] 336-878-6098   °[facility] [address], [place], [place] 336-832-9800   °[facility] [address], [place], [place] ° 336-882-2125   °[facility] [address],  °[place], [place] 1-800-853-5163 or 336-641-4981   °Substance Abuse Resources °Organization         Address  Phone  Notes  °[facility]  336-882-2125   °[facility]  336-784-9470   °[facility]  336-285-9073   °[facility]  336-845-3988   °Residential & Outpatient Substance Abuse Program  1-800-659-3381   °Psychological Services °Organization         Address  Phone  Notes  °[facility]  336- 832-9600   °[facility]  336- 378-7881   °[facility] [address], [place] 1-800-853-5163 or 336-641-4981   ° °Mobile Crisis Teams °Organization         Address  Phone  Notes  °[facility], Mobile Crisis Care Unit  1-877-626-1772   °Assertive °[facility] ° [address]  [place], [place] 336-834-9664   °Sharon DeEsch [address] °[place]  336-554-5454   ° °Self-Help/Support Groups °Organization         Address  Phone             Notes  °[facility] - variety of support groups  336- 373-1402 Call for more information  °Narcotics Anonymous (NA), [facility] [address], °[place]   2 meetings at this location  ° °  Residential Treatment Programs °Organization         Address  Phone  Notes  °[facility] [address],    °[place]  1-866-801-8205   °[facility] ° [address], [place], [place] 704-293-8524   °[facility] [address], [place] 336-845-3988 Admissions: 8am-3pm M-F  °[facility] [address]    °[place], [place] 336-841-1104   °[facility] [address], [place], [place] 336-379-7146   °[facility] [address]  °[place], [place] 336-285-9073   °[facility] - Intensive Outpatient [address], [place], [place] 336-852-3033   °[facility] [address]  °[place], [place] 1-877-615-2722 or 336-784-9470   °[facility] [address] [place], [place] 336-227-7417 Accepts Medicaid  °[facility] [address]  °[place] 1-800-659-3381 Substance Abuse/Addiction Treatment  ° °[place] Behavioral Health Resources °Organization         Address  Phone  Notes  °[facility]  (888) 581-9988   °Julie Brannon, PhD [address] [place], [place]   (336) 349-5553 or (336) 951-0000   °[facility]   [address] °[place], [place] (336) 349-4454   °[facility] [address], [place], [place] (336) 342-8316 Insurance/Medicaid/sponsorship through [facility]  °[facility] [address]                                    [place], [place] (336) 342-8316 Therapy/tele-psych/case    °[facility] [address]  ° [place], [place] (336) 349-2233    °Dr. Arfeen  (336) 349-4544   °[facility] [place]  [employer] [place] [facility] 1) [address], [place] °2) [address], [place] °3)  [address], [place] (336) 349-3220 °(336) 342-7768 ° °(336) 342-8140   °[place] [facility] (336) 342-1394 or (336) 342-3537 (After Hours)    ° ° °

## 2013-10-09 NOTE — ED Provider Notes (Signed)
CSN: [id number]     Arrival date & time 10/08/13  2138 History   First MD Initiated Contact with Patient 10/09/13 0024     Chief Complaint  Patient presents with  . 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 She states that she has never had any success with getting off of this drug, she denies ever being in a treatment program. She denies any other drugs of abuse other than frequent marijuana. In the triage area the patient stated that she felt "fabulously" denied any suicidal ideations or homicidal ideations and has been calm and cooperative. She reports to me that she was told to come to the emergency department for medical evaluation before she was able to get into a detox program. She has spoken with several programs and is on a waiting list at 1. She does not use IV drugs, she only snorts heroin  Patient is a 38 y.o. female presenting with drug problem. The history is provided by the patient.  Drug Problem    Past Medical History  Diagnosis Date  . Endometriosis   . Gonorrhea    Past Surgical History  Procedure Laterality Date  . Btl      endometriosis -surgery to repair   . Tubal ligation     No family history on file. History  Substance Use Topics  . Smoking status: Current Every Day Smoker -- 0.50 packs/day for 15 years    Types: Cigarettes  . Smokeless tobacco: Not on file  . Alcohol Use: No   OB History   Grav Para Term Preterm Abortions TAB SAB Ect Mult Living                 Review of Systems  All other systems reviewed and are negative.     Allergies  Review of patient's allergies indicates no known allergies.  Home Medications   Prior to  Admission medications   Not on File   BP 116/82  Pulse 91  Temp(Src) 99 F (37.2 C) (Oral)  Resp 18  SpO2 100%  LMP 10/08/2013 Physical Exam  Nursing note and vitals reviewed. Constitutional: She appears well-developed and well-nourished. No distress.  HENT:  Head: Normocephalic and atraumatic.  Mouth/Throat: Oropharynx is clear and moist. No oropharyngeal exudate.  Eyes: Conjunctivae and EOM are normal. Pupils are equal, round, and reactive to light. Right eye exhibits no discharge. Left eye exhibits no discharge. No scleral icterus.  Neck: Normal range of motion. Neck supple. No JVD present. No thyromegaly present.  Cardiovascular: Normal rate, regular rhythm, normal heart sounds and intact distal pulses.  Exam reveals no gallop and no friction rub.   No murmur heard. Pulmonary/Chest: Effort normal and breath sounds normal. No respiratory distress. She has no wheezes. She has no rales.  Abdominal: Soft. Bowel sounds are normal. She exhibits no distension and no mass. There is no tenderness.  Musculoskeletal: Normal range of motion. She exhibits no edema and no tenderness.  Lymphadenopathy:    She has no cervical adenopathy.  Neurological: She is alert. Coordination normal.  Skin: Skin is warm and dry. No rash noted. No erythema.  Psychiatric: She has a normal mood and affect. Her behavior is  normal.  During the interview the patient does become tearful and states that if she doesn't get help she will probably lose her job, and she denies any suicidal thoughts, she appears tearful and anxious, she is not responding to internal stimuli    ED Course  Procedures (including critical care time) Labs Review Labs Reviewed  COMPREHENSIVE METABOLIC PANEL - Abnormal; Notable for the following:    Total Bilirubin <0.2 (*)    All other components within normal limits  SALICYLATE LEVEL - Abnormal; Notable for the following:    Salicylate Lvl <2.0 (*)    All other components within normal  limits  ACETAMINOPHEN LEVEL  CBC  ETHANOL  URINE RAPID DRUG SCREEN (HOSP PERFORMED)    Imaging Review No results found.    MDM   Final diagnoses:  Heroin abuse    At this time the patient does appear anxious and is engaged in seeking for help, she denies that she was engaged in self harm upon discharge, she endorses the desire to followup in a treatment plan and a inpatient setting and agrees to use the phone numbers furnished on the resource list to do this. She will be given a copy of her blood work which shows no signs of abnormalities, her drug screen is pending at this time. The patient is amenable to discharge.    Vida Roller, MD 10/09/13 [phone]

## 2013-10-09 NOTE — ED Notes (Signed)
Resources reviewed with pt, pt given lab results and pt verbalized understanding to contact one of the resources for help with detox. Pt states she is just going to go to another hospital.

## 2013-10-09 NOTE — ED Notes (Addendum)
Pt here requesting detox from heroin. Pt last used around 1600-1700 10/08/13. Pt alert and oriented x 4. Dr. Hyacinth MeekerMiller at bedside. Pt states she has been trying to get into a rehab facility but has been told she needs to detox first. Pt was informed that we do not do opiate detox here at ED, pt became upset and states she keeps getting the run around and was told that she needed to come here to detox. Pt informed that she will get medically cleared and given resources to contact to see if she can get into a program. Pt not satisfied with this answer.

## 2014-02-02 ENCOUNTER — Ambulatory Visit: Payer: Self-pay | Admitting: Cardiovascular Disease

## 2017-03-07 ENCOUNTER — Other Ambulatory Visit: Payer: Self-pay | Admitting: *Deleted

## 2017-03-07 DIAGNOSIS — Z1231 Encounter for screening mammogram for malignant neoplasm of breast: Secondary | ICD-10-CM

## 2017-03-21 ENCOUNTER — Ambulatory Visit
Admission: RE | Admit: 2017-03-21 | Discharge: 2017-03-21 | Disposition: A | Discharge status: Home / Self Care | Payer: 59 | Source: Ambulatory Visit | Attending: *Deleted | Admitting: *Deleted

## 2017-03-21 DIAGNOSIS — Z1231 Encounter for screening mammogram for malignant neoplasm of breast: Secondary | ICD-10-CM

## 2017-06-12 ENCOUNTER — Encounter: Payer: Self-pay | Admitting: Gastroenterology

## 2017-08-12 ENCOUNTER — Encounter: Payer: Self-pay | Admitting: *Deleted

## 2017-08-13 ENCOUNTER — Encounter (INDEPENDENT_AMBULATORY_CARE_PROVIDER_SITE_OTHER): Payer: Self-pay

## 2017-08-13 ENCOUNTER — Ambulatory Visit: Payer: 59 | Admitting: Gastroenterology

## 2017-08-13 ENCOUNTER — Encounter: Payer: Self-pay | Admitting: Gastroenterology

## 2017-08-13 VITALS — BP 114/72 | HR 60 | Ht 66.0 in | Wt 157.0 lb

## 2017-08-13 DIAGNOSIS — R1013 Epigastric pain: Secondary | ICD-10-CM | POA: Diagnosis not present

## 2017-08-13 DIAGNOSIS — K219 Gastro-esophageal reflux disease without esophagitis: Secondary | ICD-10-CM

## 2017-08-13 NOTE — Patient Instructions (Signed)
You have been scheduled for an abdominal ultrasound at [facility] (1st floor of hospital) on 08/19/2017 at 9:30am. Please arrive 15 minutes prior to your appointment for registration. Make certain not to have anything to eat or drink 6 hours prior to your appointment. Should you need to reschedule your appointment, please contact radiology at [phone]. This test typically takes about 30 minutes to perform.   We will send Omeprazole and Zantac to your pharmacy   Gastroesophageal Reflux Disease, Adult Normally, food travels down the esophagus and stays in the stomach to be digested. However, when a person has gastroesophageal reflux disease (GERD), food and stomach acid move back up into the esophagus. When this happens, the esophagus becomes sore and inflamed. Over time, GERD can create small holes (ulcers) in the lining of the esophagus. What are the causes? This condition is caused by a problem with the muscle between the esophagus and the stomach (lower esophageal sphincter, or LES). Normally, the LES muscle closes after food passes through the esophagus to the stomach. When the LES is weakened or abnormal, it does not close properly, and that allows food and stomach acid to go back up into the esophagus. The LES can be weakened by certain dietary substances, medicines, and medical conditions, including:  Tobacco use.  Pregnancy.  Having a hiatal hernia.  Heavy alcohol use.  Certain foods and beverages, such as coffee, chocolate, onions, and peppermint.  What increases the risk? This condition is more likely to develop in:  People who have an increased body weight.  People who have connective tissue disorders.  People who use NSAID medicines.  What are the signs or symptoms? Symptoms of this condition include:  Heartburn.  Difficult or painful swallowing.  The feeling of having a lump in the throat.  Abitter taste in the mouth.  Bad breath.  Having a large  amount of saliva.  Having an upset or bloated stomach.  Belching.  Chest pain.  Shortness of breath or wheezing.  Ongoing (chronic) cough or a night-time cough.  Wearing away of tooth enamel.  Weight loss.  Different conditions can cause chest pain. Make sure to see your health care provider if you experience chest pain. How is this diagnosed? Your health care provider will take a medical history and perform a physical exam. To determine if you have mild or severe GERD, your health care provider may also monitor how you respond to treatment. You may also have other tests, including:  An endoscopy toexamine your stomach and esophagus with a small camera.  A test thatmeasures the acidity level in your esophagus.  A test thatmeasures how much pressure is on your esophagus.  A barium swallow or modified barium swallow to show the shape, size, and functioning of your esophagus.  How is this treated? The goal of treatment is to help relieve your symptoms and to prevent complications. Treatment for this condition may vary depending on how severe your symptoms are. Your health care provider may recommend:  Changes to your diet.  Medicine.  Surgery.  Follow these instructions at home: Diet  Follow a diet as recommended by your health care provider. This may involve avoiding foods and drinks such as: ? Coffee and tea (with or without caffeine). ? Drinks that containalcohol. ? Energy drinks and sports drinks. ? Carbonated drinks or sodas. ? Chocolate and cocoa. ? Peppermint and mint flavorings. ? Garlic and onions. ? Horseradish. ? Spicy and acidic foods, including peppers, chili powder, curry powder,  vinegar, hot sauces, and barbecue sauce. ? Citrus fruit juices and citrus fruits, such as oranges, lemons, and limes. ? Tomato-based foods, such as red sauce, chili, salsa, and pizza with red sauce. ? Fried and fatty foods, such as donuts, french fries, potato chips, and  high-fat dressings. ? High-fat meats, such as hot dogs and fatty cuts of red and white meats, such as rib eye steak, sausage, ham, and bacon. ? High-fat dairy items, such as whole milk, butter, and cream cheese.  Eat small, frequent meals instead of large meals.  Avoid drinking large amounts of liquid with your meals.  Avoid eating meals during the 2-3 hours before bedtime.  Avoid lying down right after you eat.  Do not exercise right after you eat. General instructions  Pay attention to any changes in your symptoms.  Take over-the-counter and prescription medicines only as told by your health care provider. Do not take aspirin, ibuprofen, or other NSAIDs unless your health care provider told you to do so.  Do not use any tobacco products, including cigarettes, chewing tobacco, and e-cigarettes. If you need help quitting, ask your health care provider.  Wear loose-fitting clothing. Do not wear anything tight around your waist that causes pressure on your abdomen.  Raise (elevate) the head of your bed 6 inches (15cm).  Try to reduce your stress, such as with yoga or meditation. If you need help reducing stress, ask your health care provider.  If you are overweight, reduce your weight to an amount that is healthy for you. Ask your health care provider for guidance about a safe weight loss goal.  Keep all follow-up visits as told by your health care provider. This is important. Contact a health care provider if:  You have new symptoms.  You have unexplained weight loss.  You have difficulty swallowing, or it hurts to swallow.  You have wheezing or a persistent cough.  Your symptoms do not improve with treatment.  You have a hoarse voice. Get help right away if:  You have pain in your arms, neck, jaw, teeth, or back.  You feel sweaty, dizzy, or light-headed.  You have chest pain or shortness of breath.  You vomit and your vomit looks like blood or coffee grounds.  You  faint.  Your stool is bloody or black.  You cannot swallow, drink, or eat. This information is not intended to replace advice given to you by your health care provider. Make sure you discuss any questions you have with your health care provider. Document Released: 10/03/2004 Document Revised: 05/24/2015 Document Reviewed: 04/20/2014 Elsevier Interactive Patient Education  2018 [employer]   Food Choices for Gastroesophageal Reflux Disease, Adult When you have gastroesophageal reflux disease (GERD), the foods you eat and your eating habits are very important. Choosing the right foods can help ease your discomfort. What guidelines do I need to follow?  Choose fruits, vegetables, whole grains, and low-fat dairy products.  Choose low-fat meat, fish, and poultry.  Limit fats such as oils, salad dressings, butter, nuts, and avocado.  Keep a food diary. This helps you identify foods that cause symptoms.  Avoid foods that cause symptoms. These may be different for everyone.  Eat small meals often instead of 3 large meals a day.  Eat your meals slowly, in a place where you are relaxed.  Limit fried foods.  Cook foods using methods other than frying.  Avoid drinking alcohol.  Avoid drinking large amounts of liquids with your meals.  Avoid bending  over or lying down until 2-3 hours after eating. What foods are not recommended? These are some foods and drinks that may make your symptoms worse: Vegetables Tomatoes. Tomato juice. Tomato and spaghetti sauce. Chili peppers. Onion and garlic. Horseradish. Fruits Oranges, grapefruit, and lemon (fruit and juice). Meats High-fat meats, fish, and poultry. This includes hot dogs, ribs, ham, sausage, salami, and bacon. Dairy Whole milk and chocolate milk. Sour cream. Cream. Butter. Ice cream. Cream cheese. Drinks Coffee and tea. Bubbly (carbonated) drinks or energy drinks. Condiments Hot sauce. Barbecue  sauce. Sweets/Desserts Chocolate and cocoa. Donuts. Peppermint and spearmint. Fats and Oils High-fat foods. This includes [place] fries and potato chips. Other Vinegar. Strong spices. This includes black pepper, white pepper, red pepper, cayenne, curry powder, cloves, ginger, and chili powder. The items listed above may not be a complete list of foods and drinks to avoid. Contact your dietitian for more information. This information is not intended to replace advice given to you by your health care provider. Make sure you discuss any questions you have with your health care provider. Document Released: 06/25/2011 Document Revised: 06/01/2015 Document Reviewed: 10/28/2012 Elsevier Interactive Patient Education  2017 [employer].

## 2017-08-13 NOTE — Progress Notes (Signed)
Carrie Hammond    [id number]    1975-12-19  Primary Care Physician:Long, Donia Ast  Referring Physician: No referring provider defined for this encounter.  Chief complaint:  GERD  HPI: 42 year old female here with complaints of worsening heartburn. She has had heartburn on and off during her whole life, before she was having it infrequently once or twice a month In the past 3-4 months  started having almost daily, took Prilosec OTC whole bottle with no improvement Also complains of severe epigastric pain raditing to the back. She gets these episode once every couple years. Has associated nausea, vomting and almost passed out when she had a episode last year.Takes Ibuprofen to alleviate the pain during these episodes. Last episode was a year ago. She has history of gallstones. She has occasional very small volume bright red blood per rectum when she wipes after a bowel movement, has had it for many years , thinks it is from hemorrhoids.  Denies any recent episodes of rectal bleeding in the past couple years.  Denies any loss of appetite or weight loss. Aunt had colitis and had colostomy No family history of GI cancer    No outpatient encounter medications on file as of 08/13/2017.   No facility-administered encounter medications on file as of 08/13/2017.     Allergies as of 08/13/2017  . (No Known Allergies)    Past Medical History:  Diagnosis Date  . Anxiety   . Endometriosis   . Gonorrhea     Past Surgical History:  Procedure Laterality Date  . btl     endometriosis -surgery to repair   . TUBAL LIGATION      No family history on file.  Social History   Socioeconomic History  . Marital status: Single    Spouse name: Not on file  . Number of children: Not on file  . Years of education: Not on file  . Highest education level: Not on file  Occupational History  . Not on file  Social Needs  . Financial resource strain: Not on file  . Food  insecurity:    Worry: Not on file    Inability: Not on file  . Transportation needs:    Medical: Not on file    Non-medical: Not on file  Tobacco Use  . Smoking status: Former Smoker    Packs/day: 0.50    Years: 15.00    Pack years: 7.50    Types: Cigarettes    Last attempt to quit: 07/13/2017    Years since quitting: 0.0  . Smokeless tobacco: Never Used  Substance and Sexual Activity  . Alcohol use: No  . 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 Sexual activity: Not on file    Comment: tubal ligation  Lifestyle  . Physical activity:    Days per week: Not on file    Minutes per session: Not on file  . Stress: Not on file  Relationships  . Social connections:    Talks on phone: Not on file    Gets together: Not on file    Attends religious service: Not on file    Active member of club or organization: Not on file    Attends meetings of clubs or organizations: Not on file    Relationship status: Not on file  . Intimate partner violence:    Fear  of current or ex partner: Not on file    Emotionally abused: Not on file    Physically abused: Not on file    Forced sexual activity: Not on file  Other Topics Concern  . Not on file  Social History Narrative  . Not on file      Review of systems: Review of Systems  Constitutional: Negative for fever and chills.  HENT: Negative.   Eyes: Negative for blurred vision.  Respiratory: Negative for cough, shortness of breath and wheezing.   Cardiovascular: Negative for chest pain and palpitations.  Gastrointestinal: as per HPI Genitourinary: Negative for dysuria, urgency, frequency and hematuria.  Musculoskeletal: Positive for myalgias, back pain and joint pain.  Skin: Negative for itching and rash.  Neurological: Negative for dizziness, tremors, focal weakness, seizures and loss of consciousness.  Positive for sleeping problems Endo/Heme/Allergies: Positive for seasonal  allergies.  Psychiatric/Behavioral: Negative for suicidal ideas and hallucinations.  Positive for anxiety and depression All other systems reviewed and are negative.   Physical Exam: Vitals:   08/13/17 1416  BP: 114/72  Pulse: 60   Body mass index is 25.34 kg/m. Gen:      No acute distress HEENT:  EOMI, sclera anicteric Neck:     No masses; no thyromegaly Lungs:    Clear to auscultation bilaterally; normal respiratory effort CV:         Regular rate and rhythm; no murmurs Abd:      + bowel sounds; soft, non-tender; no palpable masses, no distension Ext:    No edema; adequate peripheral perfusion Skin:      Warm and dry; no rash Neuro: alert and oriented x 3 Psych: normal mood and affect  Data Reviewed:  Reviewed labs, radiology imaging, old records and pertinent past GI work up   Assessment and Plan/Recommendations:  42 year old female here with complaints of worsening heartburn and GERD related symptoms despite using over-the-counter PPI daily Will start omeprazole 40 mg daily, 30 minutes before breakfast Zantac 150 mg at bedtime as needed If continues to have persistent heartburn will consider EGD to exclude severe esophagitis or other etiology Discussed antireflux measures and lifestyle modifications in detail  Intermittent epigastric abdominal pain :obtain abdominal ultrasound to exclude gallbladder disease  Greater than 50% of the time used for counseling as well as treatment plan and follow-up. She had multiple questions which were answered to her satisfaction  K. Scherry RanVeena Nandigam , MD [phone]    CC: No ref. provider found

## 2017-08-19 ENCOUNTER — Encounter: Payer: Self-pay | Admitting: Gastroenterology

## 2017-08-19 ENCOUNTER — Ambulatory Visit (HOSPITAL_COMMUNITY)
Admission: RE | Admit: 2017-08-19 | Discharge: 2017-08-19 | Disposition: A | Discharge status: Home / Self Care | Payer: 59 | Source: Ambulatory Visit | Attending: Gastroenterology | Admitting: Gastroenterology

## 2017-08-19 DIAGNOSIS — K219 Gastro-esophageal reflux disease without esophagitis: Secondary | ICD-10-CM

## 2017-08-19 DIAGNOSIS — R1013 Epigastric pain: Secondary | ICD-10-CM | POA: Insufficient documentation

## 2017-08-20 ENCOUNTER — Other Ambulatory Visit: Payer: Self-pay | Admitting: *Deleted

## 2017-08-20 MED ORDER — OMEPRAZOLE 40 MG PO CPDR: 40.0000 mg | DELAYED_RELEASE_CAPSULE | Freq: Every day | ORAL | 3 refills | Status: AC

## 2017-08-20 MED ORDER — RANITIDINE HCL 150 MG PO TABS: 150.0000 mg | ORAL_TABLET | Freq: Every day | ORAL | 3 refills | Status: AC

## 2019-03-25 IMAGING — US US ABDOMEN COMPLETE
1 series · 14 of 25 positions shown · non-contrast
Comparison: None.

CLINICAL DATA: Chronic abdominal pain

EXAM:
ABDOMEN ULTRASOUND COMPLETE

[Series 1: us abdomen complete · 0.20mm/px · 14 of 89 slices shown]
[im 1/89]
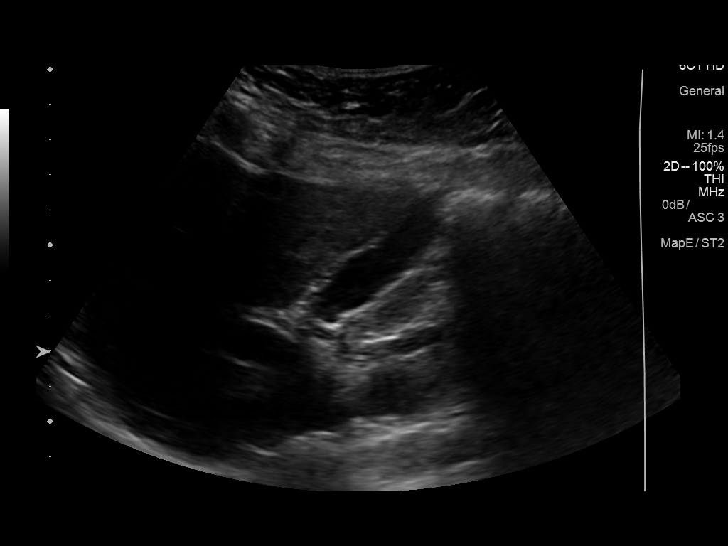
[im 8/89]
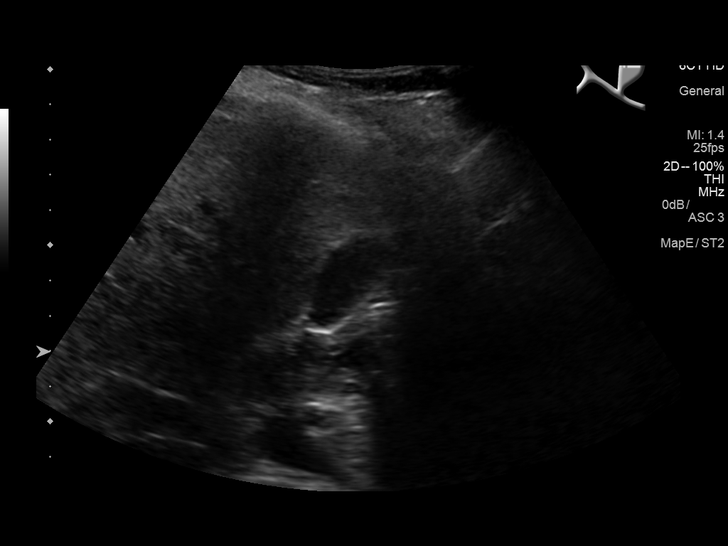
[im 15/89]
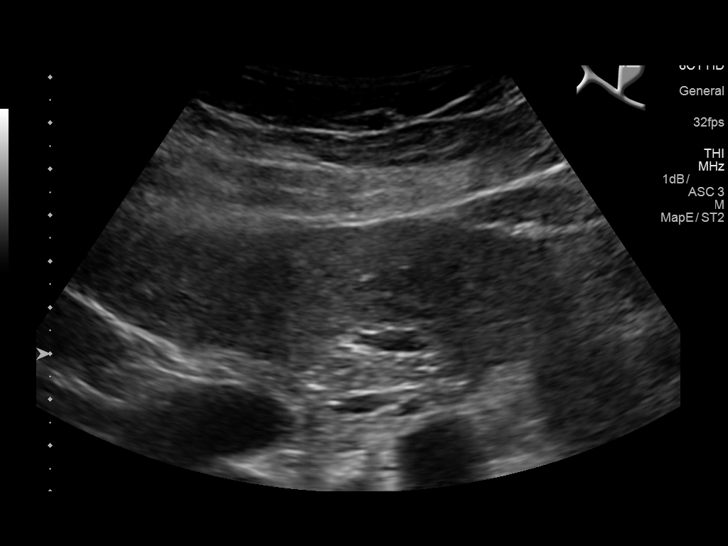
[im 23/89]
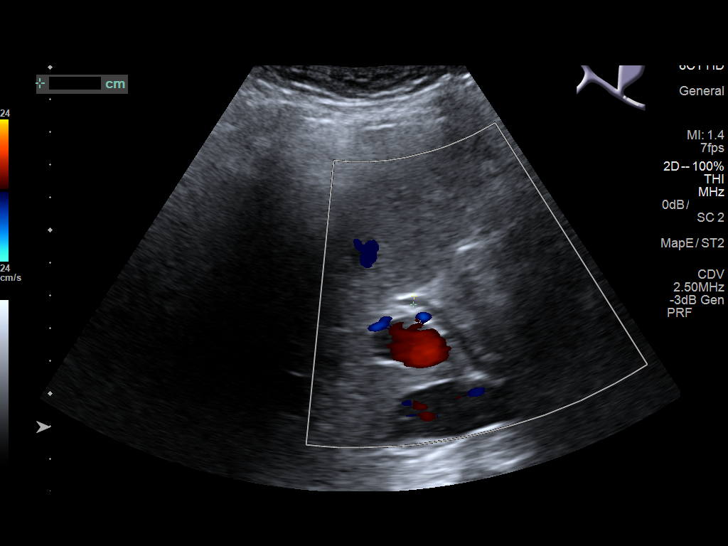
[im 30/89]
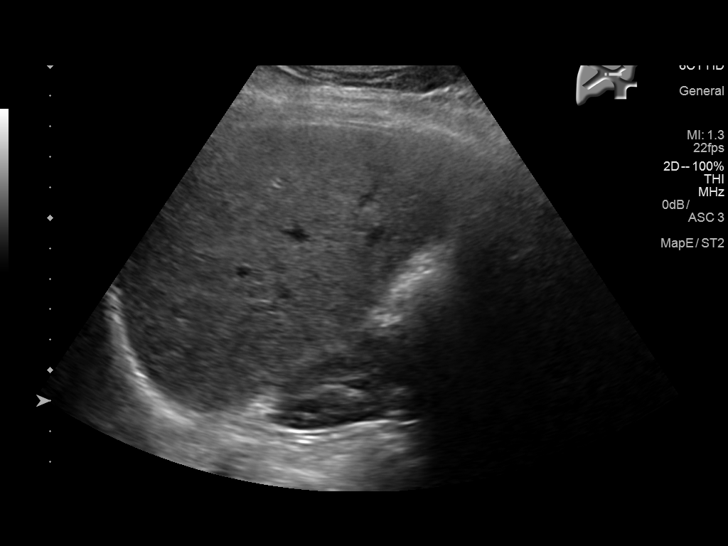
[im 34/89]
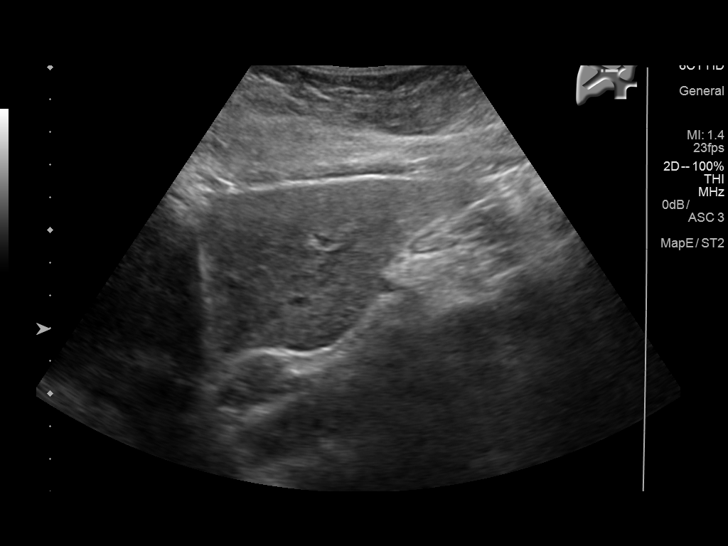
[im 41/89]
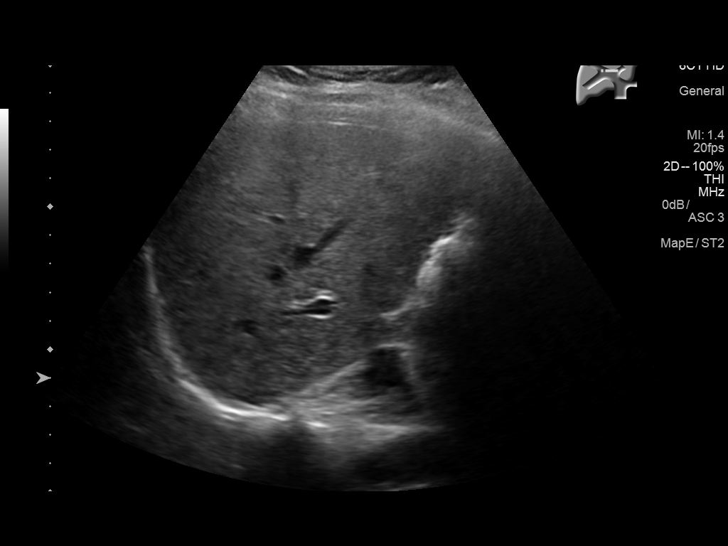
[im 48/89]
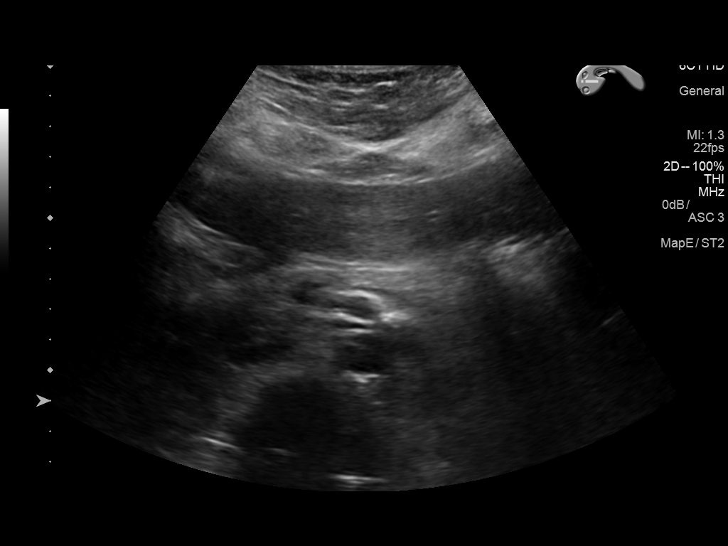
[im 56/89]
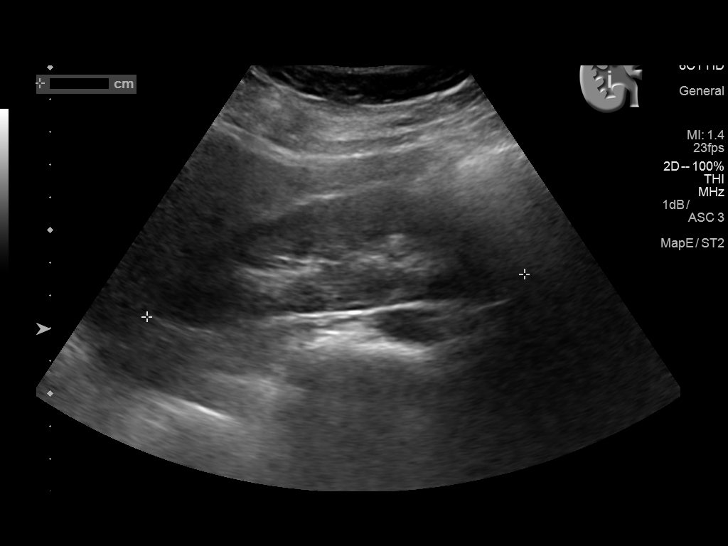
[im 59/89]
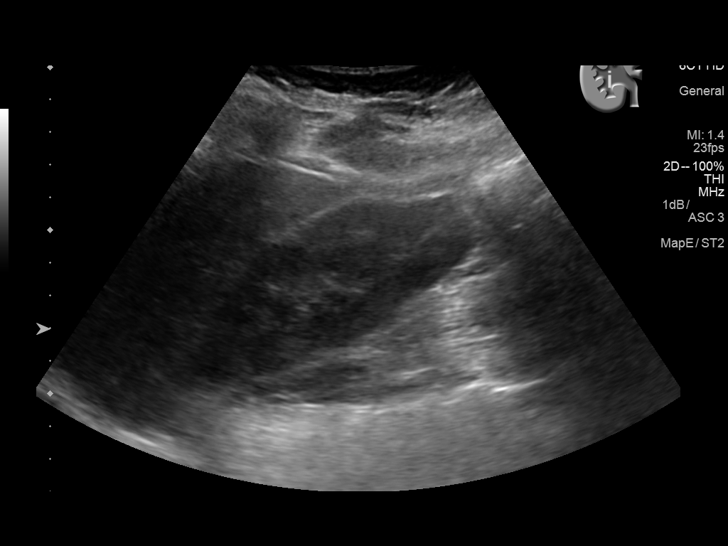
[im 67/89]
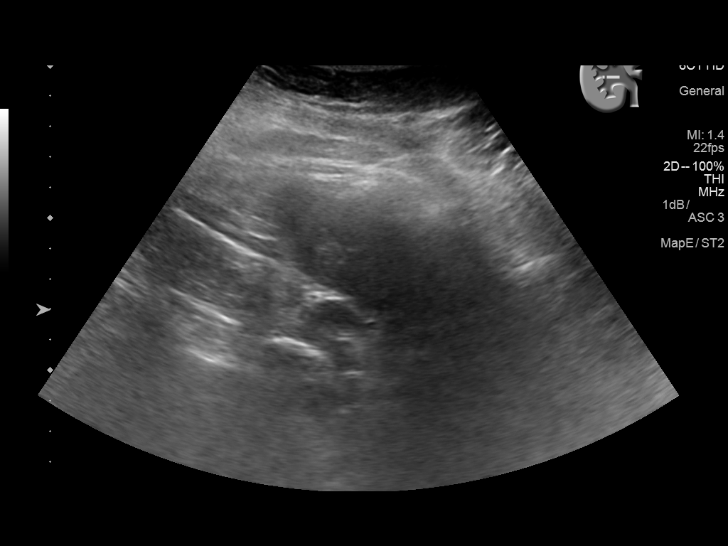
[im 74/89]
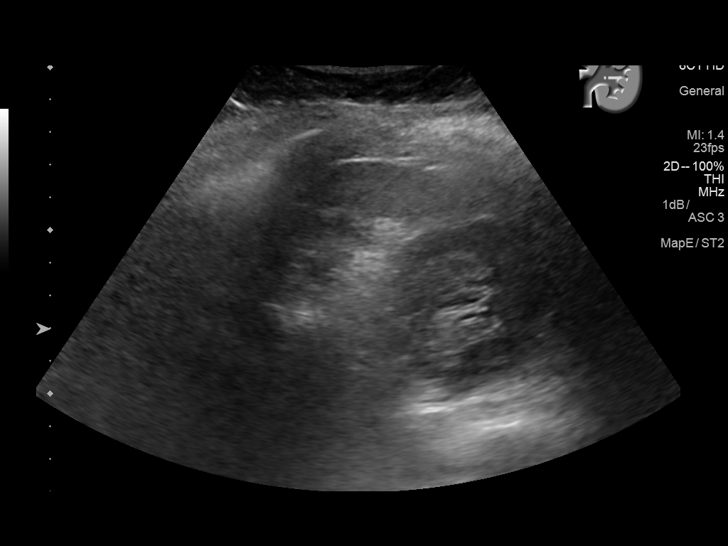
[im 81/89]
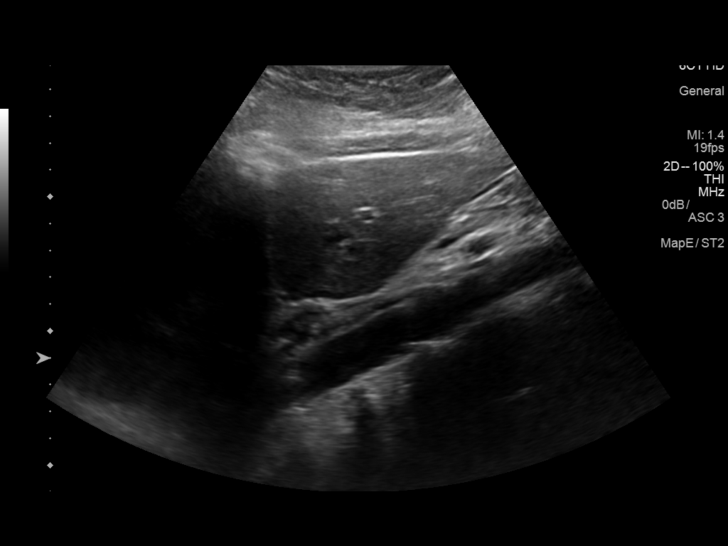
[im 89/89]
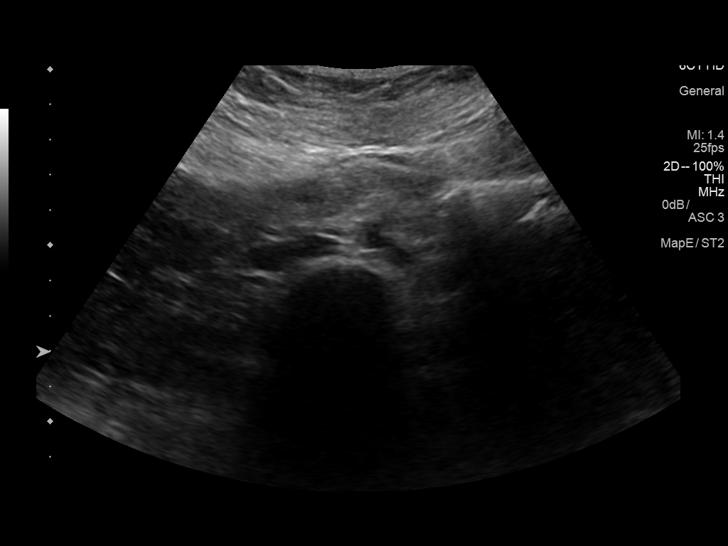

[14 of 25 positions shown; findings below may reference images not displayed]

FINDINGS: Gallbladder: No gallstones or wall thickening visualized. There is
no pericholecystic fluid. No sonographic Murphy sign noted by
sonographer.

Common bile duct: Diameter: 3 mm. No intrahepatic, common hepatic,
or common bile duct dilatation.

Liver: No focal lesion identified. Within normal limits in
parenchymal echogenicity. Portal vein is patent on color Doppler
imaging with normal direction of blood flow towards the liver.

IVC: No abnormality visualized.

Pancreas: Visualized portion unremarkable. Portions of the
pancreatic tail are obscured by gas.

Spleen: Size and appearance within normal limits.

Right Kidney: Length: 11.6 cm. Echogenicity within normal limits. No
mass or hydronephrosis visualized.

Left Kidney: Length: 11.1 cm. Echogenicity within normal limits. No
mass or hydronephrosis visualized.

Abdominal aorta: No aneurysm visualized.

Other findings: No demonstrable ascites.
IMPRESSION: Portions of pancreatic tail obscured by gas. Visualized portions of
pancreas appear normal.

Study otherwise unremarkable.

## 2019-07-07 IMAGING — MG DIGITAL SCREENING BILATERAL MAMMOGRAM WITH TOMO AND CAD
8 series · 9 of 24 positions shown · non-contrast
Comparison: None.

CLINICAL DATA: Screening. Baseline examination.

EXAM:
DIGITAL SCREENING BILATERAL MAMMOGRAM WITH TOMO AND CAD

[L CC synth-2D]
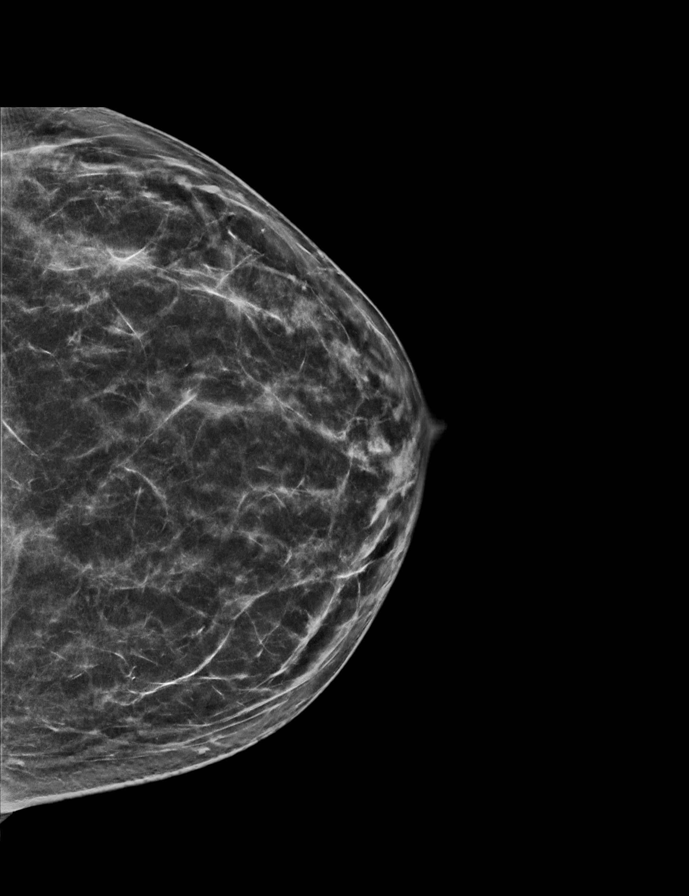

[L MLO synth-2D]
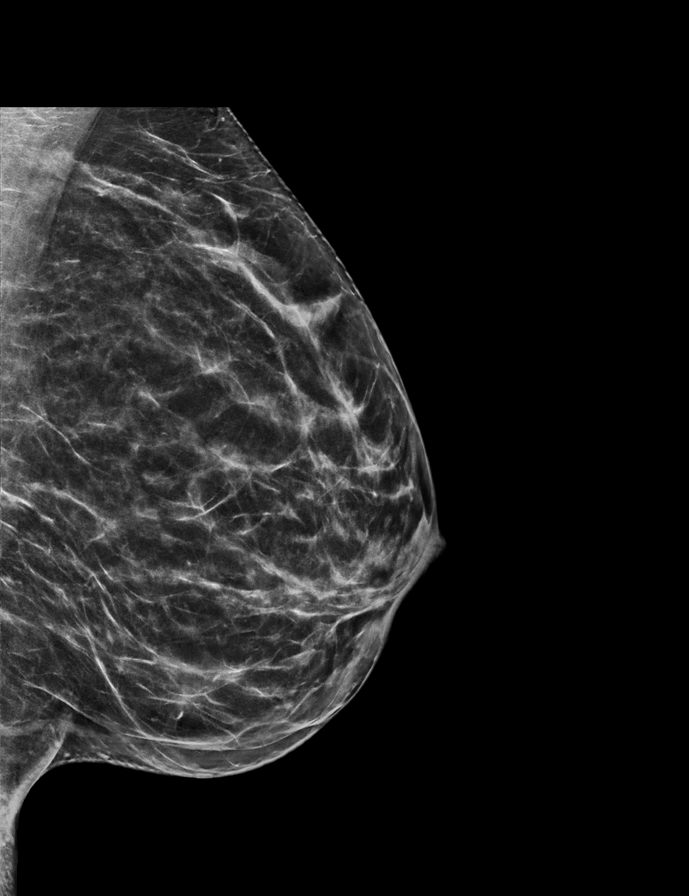

[R MLO synth-2D]
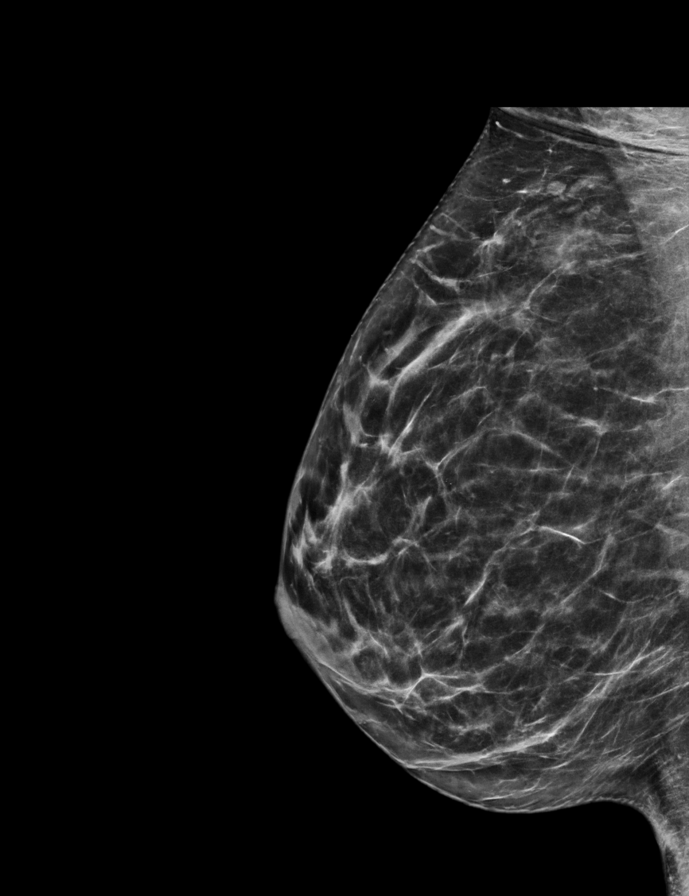

[R CC synth-2D]
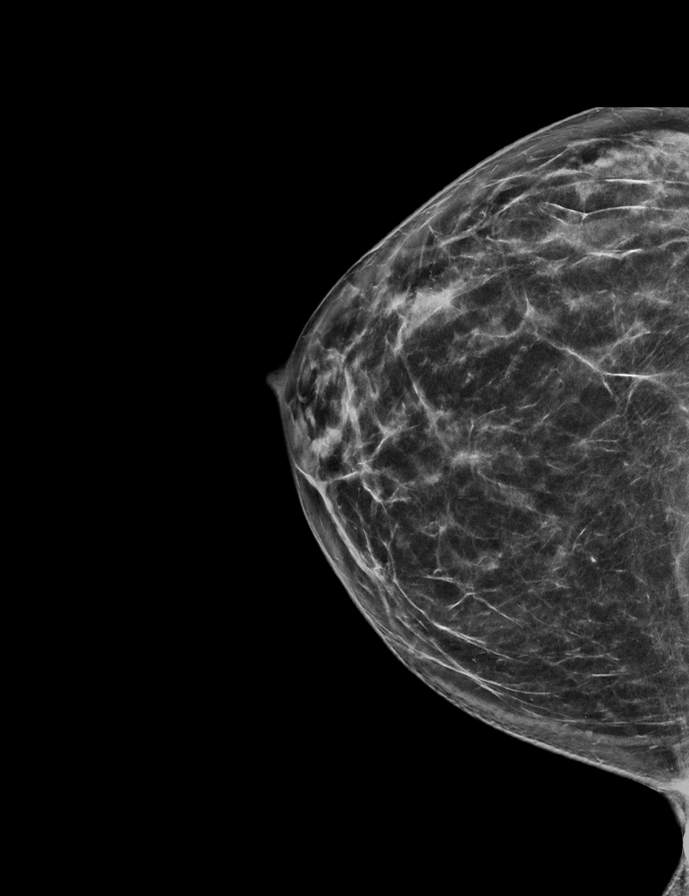

[L CC tomo · 2 of 66 frames shown]
[frame 22/66]
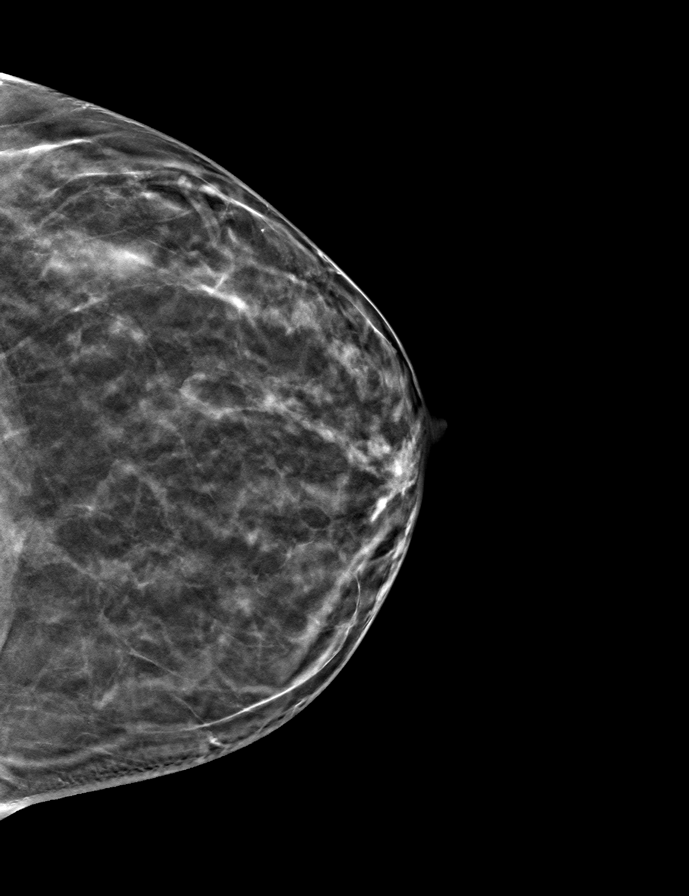
[frame 33/66]
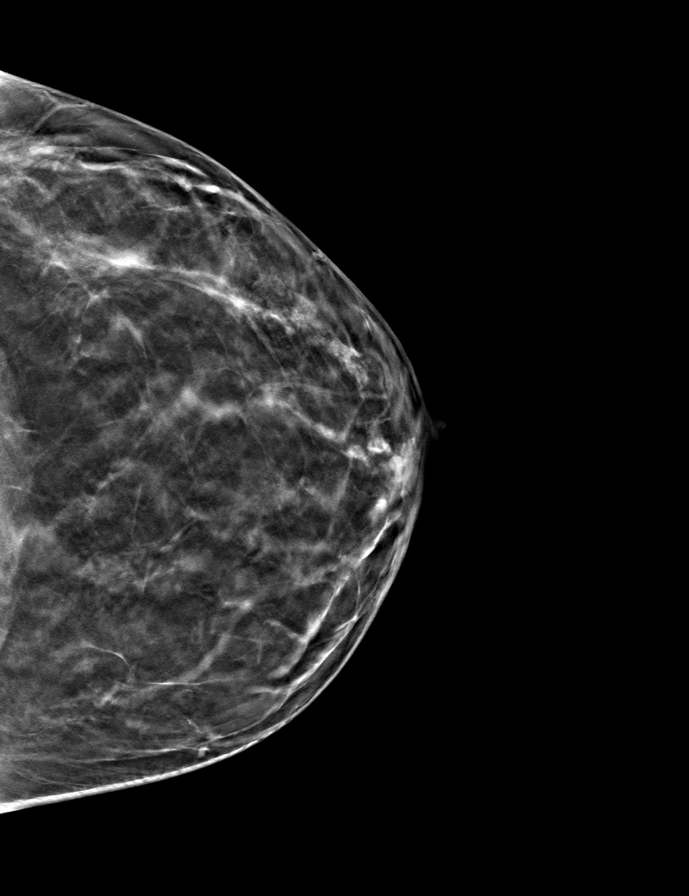

[R CC tomo · tomo slice 33/64.0]
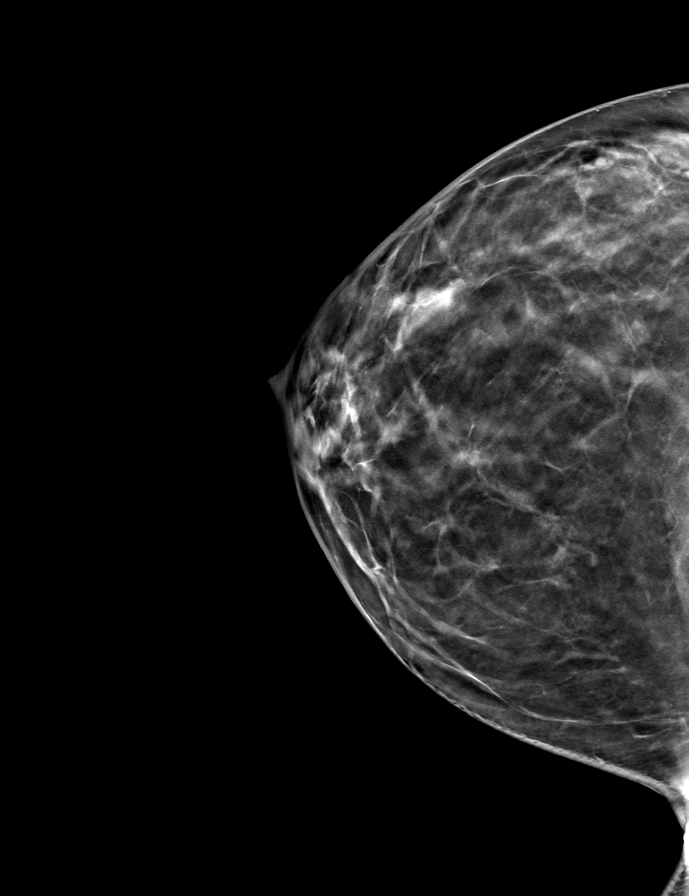

[R MLO tomo · tomo slice 33/65.0]
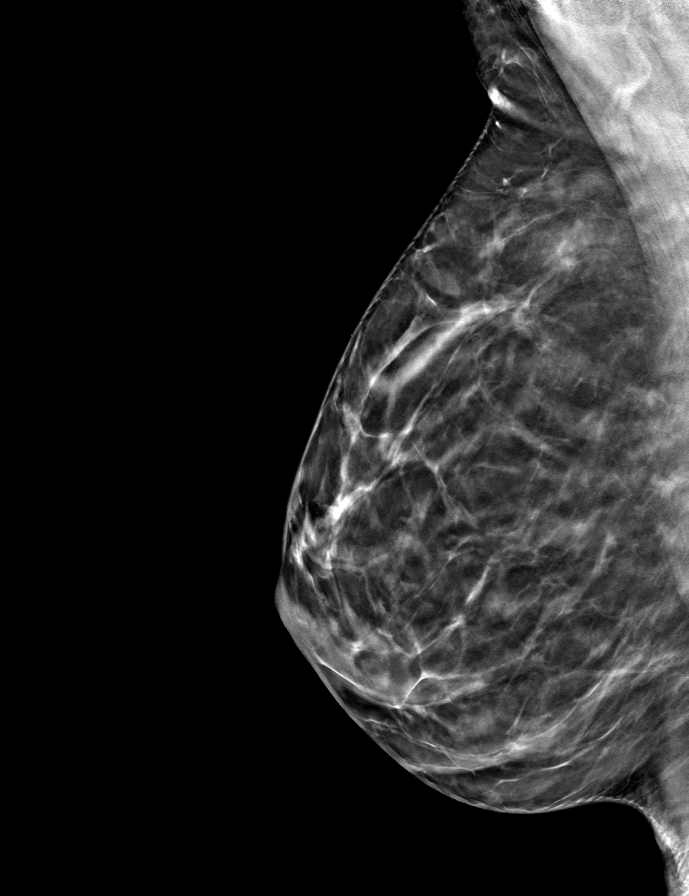

[L MLO tomo · tomo slice 33/65.0]
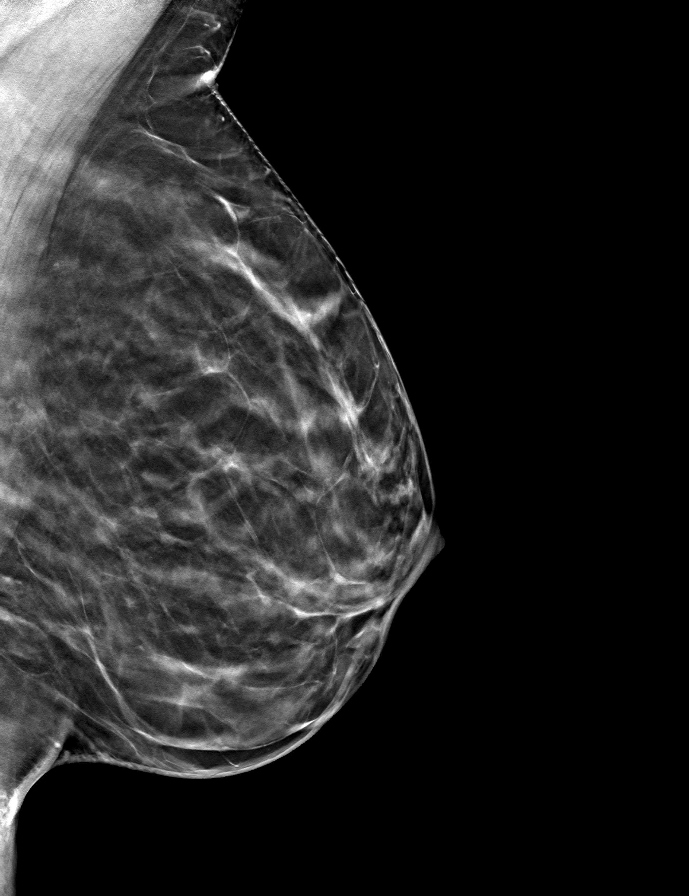

[9 of 24 positions shown; findings below may reference images not displayed]

ACR Breast Density Category b: There are scattered areas of
fibroglandular density.
FINDINGS: There are no findings suspicious for malignancy. Images were
processed with CAD.
IMPRESSION: No mammographic evidence of malignancy. A result letter of this
screening mammogram will be mailed directly to the patient.

RECOMMENDATION:
Screening mammogram in one year. (Code:[id number])

BI-RADS CATEGORY  1: Negative.
